# Patient Record
Sex: Male | Born: 1965 | Race: White | Hispanic: No | Marital: Married | Smoking: Never smoker
Health system: Southern US, Community
[De-identification: ages and names within clinical notes are randomized; demographics above are authoritative.]

## PROBLEM LIST (undated history)

## (undated) DIAGNOSIS — E538 Deficiency of other specified B group vitamins: Secondary | ICD-10-CM

## (undated) DIAGNOSIS — J942 Hemothorax: Secondary | ICD-10-CM

## (undated) DIAGNOSIS — S02609A Fracture of mandible, unspecified, initial encounter for closed fracture: Secondary | ICD-10-CM

## (undated) DIAGNOSIS — E215 Disorder of parathyroid gland, unspecified: Secondary | ICD-10-CM

## (undated) DIAGNOSIS — S0291XA Unspecified fracture of skull, initial encounter for closed fracture: Secondary | ICD-10-CM

## (undated) DIAGNOSIS — Z91018 Allergy to other foods: Secondary | ICD-10-CM

## (undated) DIAGNOSIS — E785 Hyperlipidemia, unspecified: Secondary | ICD-10-CM

## (undated) HISTORY — PX: OTHER SURGICAL HISTORY: SHX169

## (undated) HISTORY — PX: CHEST TUBE INSERTION: SHX231

## (undated) HISTORY — PX: BIOPSY THYROID: PRO38

---

## 2003-10-17 ENCOUNTER — Inpatient Hospital Stay (HOSPITAL_COMMUNITY): Admission: EM | Admit: 2003-10-17 | Discharge: 2003-10-18 | Payer: Self-pay | Admitting: Emergency Medicine

## 2009-10-05 ENCOUNTER — Emergency Department (HOSPITAL_COMMUNITY): Admission: EM | Admit: 2009-10-05 | Discharge: 2009-10-05 | Payer: Self-pay | Admitting: Emergency Medicine

## 2009-10-06 ENCOUNTER — Emergency Department (HOSPITAL_COMMUNITY): Admission: EM | Admit: 2009-10-06 | Discharge: 2009-10-06 | Payer: Self-pay | Admitting: Emergency Medicine

## 2010-04-08 ENCOUNTER — Emergency Department (HOSPITAL_COMMUNITY): Admission: EM | Admit: 2010-04-08 | Discharge: 2010-04-08 | Payer: Self-pay | Admitting: Emergency Medicine

## 2010-09-10 LAB — COMPREHENSIVE METABOLIC PANEL
ALT: 25 U/L (ref 0–53)
AST: 19 U/L (ref 0–37)
Alkaline Phosphatase: 110 U/L (ref 39–117)
CO2: 30 mEq/L (ref 19–32)
Calcium: 9.7 mg/dL (ref 8.4–10.5)
Chloride: 105 mEq/L (ref 96–112)
GFR calc Af Amer: >60 mL/min (ref >60)
GFR calc non Af Amer: >60 mL/min (ref >60)
Glucose, Bld: 83 mg/dL (ref 70–99)
Potassium: 3.6 mEq/L (ref 3.5–5.1)
Sodium: 140 mEq/L (ref 135–145)

## 2010-09-10 LAB — DIFFERENTIAL
Basophils Relative: 0 % (ref 0–1)
Eosinophils Absolute: 0.6 10*3/uL (ref 0.0–0.7)
Eosinophils Relative: 6 % — ABNORMAL HIGH (ref 0–5)
Lymphs Abs: 1.8 10*3/uL (ref 0.7–4.0)
Neutrophils Relative %: 69 % (ref 43–77)

## 2010-09-10 LAB — POCT CARDIAC MARKERS
CKMB, poc: 1.1 ng/mL (ref 1.0–8.0)
Troponin i, poc: <0.05 ng/mL (ref 0.00–0.09)

## 2010-09-10 LAB — CBC
Hemoglobin: 15.4 g/dL (ref 13.0–17.0)
MCHC: 34.3 g/dL (ref 30.0–36.0)
RBC: 5.08 MIL/uL (ref 4.22–5.81)
WBC: 9.6 10*3/uL (ref 4.0–10.5)

## 2010-09-10 LAB — D-DIMER, QUANTITATIVE: D-Dimer, Quant: 0.26 ug/mL-FEU (ref 0.00–0.48)
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
## 2010-11-08 NOTE — Discharge Summary (Signed)
NAME:  Jeff Schultz, Jeff Schultz NO.:  [id number]   MEDICAL RECORD NO.:  [id number]                   PATIENT TYPE:  INP   LOCATION:  4711                                 FACILITY:  [facility]   PHYSICIAN:  Rollene Rotunda, M.D.                DATE OF BIRTH:  Mar 31, 1966   DATE OF ADMISSION:  10/17/2003  DATE OF DISCHARGE:  10/18/2003                                 DISCHARGE SUMMARY   PROCEDURES:  1. Cardiac catheterization.  2. Coronary arteriogram.  3. Left ventriculogram.   HOSPITAL COURSE:  Jeff Schultz is a 45 year old male with no known history  of coronary artery disease.  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  He had an echocardiogram and blood work by his description and was  discharged on October 03, 2003.  He was supposed to follow up with a Dr. Ladona Ridgel  in [place] and have a chest CT as well.  He had, by description he had  a Dobutamine Cardiolite that was negative on October 09, 2003.  He also had an  abdominal ultrasound.  He went back to work on the day before admission  having been treated with Protonix.  On the day of admission, he developed  chest pain at work and it was felt that he had some slurred speech and some  confusion.  His pain started on his left side of the chest at about 10:30  a.m.  He was admitted to rule out MI and for further evaluation.   His cardiac risk factors include a strong family history of premature  coronary artery disease.  Because of that and because of recurrent pain with  a work up that was otherwise negative, it was felt that a cardiac  catheterization was indicated and this was performed on October 18, 2003.   A cardiac catheterization showed no significant coronary artery disease.  He  had an osteal 25% stenosis in the ramus intermedius.  No other significant  disease except for some luminal irregularities in the RCA which was  the  dominant vessel.  His EF was 65% with normal wall motion.  Dr. Antoine Poche  evaluated the films and felt that he had minimal coronary plaquing and a  normal LV.  No further cardiac work up was indicated.   A smoking cessation consult was called and he was seen for this while he was  in the hospital.  At 1745, his groin was without ecchymosis or hematomas.  He was given instructions fluid restrictions and care for his wound and was  deemed stable for discharge on October 18, 2003.   DISCHARGE CONDITION:  Stable.   DISCHARGE DIAGNOSES:  1. Chest pain, no significant coronary artery disease by catheter this     admission with a normal ejection fraction.  2. History of allergies to penicillin, antihistamines and  aspirin.  3. Family history of premature coronary artery disease.  4. Hyperlipidemia.  5. History of tobacco use.  6. History of recent negative chest CT scan and negative Dobutamine     Cardiolite by report.   DISCHARGE INSTRUCTIONS:  His activity level is to include no strenuous  activity for the next few days.  He is to call the office for any problems  with the catheter site.  He is to stick to a low fat diet.  He is to follow  up with Dr. Larena Sox in [place], [place], see him within two weeks.   DISCHARGE MEDICATIONS:  1. Protonix 40 mg daily.  2. Aspirin 81 mg daily.      Theodore Demark, P.A. [facility]                  Rollene Rotunda, M.D.    RB/MEDQ  D:  11/08/2003  T:  11/09/2003  Job:  [id number]   cc:   Larena Sox, M.D.  Liam Graham, M.D.   Rollene Rotunda, M.D.

## 2010-11-08 NOTE — Cardiovascular Report (Signed)
NAME:  Jeff Schultz, WEDIN NO.:  [id number]   MEDICAL RECORD NO.:  [id number]                   PATIENT TYPE:  INP   LOCATION:  4711                                 FACILITY:  [facility]   PHYSICIAN:  Rollene Rotunda, M.D.                DATE OF BIRTH:  1966/01/08   DATE OF PROCEDURE:  10/18/2003  DATE OF DISCHARGE:  10/18/2003                              CARDIAC CATHETERIZATION   The primary is Dr. Benito Mccreedy, [address] [facility] [address], [place]. [address],  [place], [place] [address].   PROCEDURE:  Left heart catheterization/coronary arteriography.   INDICATION:  Evaluate patient with chest pain suggestive of unstable angina.   PROCEDURAL NOTE:  Left heart catheterization is performed in the right  femoral artery.  The artery was cannulated using an anterior wall puncture.  A #6 French arterial sheath was inserted via the modified Seldinger  technique.  Preformed Judkins and a pigtail catheter were utilized.  The  patient tolerated the procedure well and left the lab in stable condition.   RESULTS:   HEMODYNAMICS:  LV 112/18, AO 118/89.   CORONARIES:  1. Left main was normal.  2. The LAD was normal.  3. The circumflex in the AV groove was normal.  4. The ramus intermedius was very large and had ostial 25% stenosis.  5. The first obtuse marginal was large and normal.  6. Right coronary artery was dominant.  There was a small PDA and     posterolateral.  There were proximal and mid luminal irregularities.   LEFT VENTRICULOGRAM:  The left ventriculogram was obtained in the RAO  projection.  The EF was 65% with normal wall motion.   CONCLUSION:  1. Minimal coronary plaque.  2. Normal left ventricular function.   PLAN:  No further cardiac workup is suggested.  The patient's pain appears  to be nonanginal.                                               Rollene Rotunda, M.D.    JH/MEDQ  D:  10/18/2003  T:  10/18/2003  Job:  [id number]   cc:   Benito Mccreedy, Dr.  [address]  [address]  [place], [place] [address]

## 2011-06-25 ENCOUNTER — Emergency Department (HOSPITAL_COMMUNITY)
Admission: EM | Admit: 2011-06-25 | Discharge: 2011-06-25 | Disposition: A | Discharge status: Home / Self Care | Payer: Self-pay | Attending: Emergency Medicine | Admitting: Emergency Medicine

## 2011-06-25 ENCOUNTER — Emergency Department (HOSPITAL_COMMUNITY): Payer: Self-pay

## 2011-06-25 ENCOUNTER — Encounter: Payer: Self-pay | Admitting: *Deleted

## 2011-06-25 DIAGNOSIS — R10813 Right lower quadrant abdominal tenderness: Secondary | ICD-10-CM | POA: Insufficient documentation

## 2011-06-25 DIAGNOSIS — R112 Nausea with vomiting, unspecified: Secondary | ICD-10-CM | POA: Insufficient documentation

## 2011-06-25 DIAGNOSIS — R109 Unspecified abdominal pain: Secondary | ICD-10-CM | POA: Insufficient documentation

## 2011-06-25 HISTORY — DX: Hemothorax: J94.2

## 2011-06-25 LAB — CBC
MCH: 29.2 pg (ref 26.0–34.0)
MCHC: 34.2 g/dL (ref 30.0–36.0)
Platelets: 266 10*3/uL (ref 150–400)

## 2011-06-25 LAB — COMPREHENSIVE METABOLIC PANEL
ALT: 26 U/L (ref 0–53)
AST: 18 U/L (ref 0–37)
Albumin: 4.9 g/dL (ref 3.5–5.2)
Alkaline Phosphatase: 133 U/L — ABNORMAL HIGH (ref 39–117)
Potassium: 4 mEq/L (ref 3.5–5.1)
Sodium: 137 mEq/L (ref 135–145)
Total Protein: 8.9 g/dL — ABNORMAL HIGH (ref 6.0–8.3)

## 2011-06-25 LAB — URINALYSIS, ROUTINE W REFLEX MICROSCOPIC
Bilirubin Urine: NEGATIVE
Ketones, ur: NEGATIVE mg/dL
Leukocytes, UA: NEGATIVE
Nitrite: NEGATIVE
Specific Gravity, Urine: 1.013 (ref 1.005–1.030)
Urobilinogen, UA: 0.2 mg/dL (ref 0.0–1.0)

## 2011-06-25 LAB — DIFFERENTIAL
Basophils Absolute: 0 10*3/uL (ref 0.0–0.1)
Basophils Relative: 0 % (ref 0–1)
Eosinophils Absolute: 0.8 10*3/uL — ABNORMAL HIGH (ref 0.0–0.7)
Neutro Abs: 6.2 10*3/uL (ref 1.7–7.7)
Neutrophils Relative %: 59 % (ref 43–77)

## 2011-06-25 LAB — URINE MICROSCOPIC-ADD ON

## 2011-06-25 MED ORDER — MORPHINE SULFATE 4 MG/ML IJ SOLN
4.0000 mg | Freq: Once | INTRAMUSCULAR | Status: AC
  Administered 2011-06-25: 4 mg via INTRAVENOUS
  Filled 2011-06-25: qty 1

## 2011-06-25 MED ORDER — ONDANSETRON HCL 4 MG/2ML IJ SOLN
4.0000 mg | Freq: Once | INTRAMUSCULAR | Status: AC
  Administered 2011-06-25: 4 mg via INTRAVENOUS
  Filled 2011-06-25: qty 2

## 2011-06-25 MED ORDER — HYDROCODONE-ACETAMINOPHEN 5-500 MG PO TABS: 1.0000 | ORAL_TABLET | Freq: Four times a day (QID) | ORAL | Status: AC | PRN

## 2011-06-25 MED ORDER — IOHEXOL 300 MG/ML  SOLN
100.0000 mL | Freq: Once | INTRAMUSCULAR | Status: AC | PRN
  Administered 2011-06-25: 100 mL via INTRAVENOUS

## 2011-06-25 MED ORDER — SODIUM CHLORIDE 0.9 % IV BOLUS (SEPSIS)
1000.0000 mL | Freq: Once | INTRAVENOUS | Status: AC
  Administered 2011-06-25: 1000 mL via INTRAVENOUS

## 2011-06-25 NOTE — ED Provider Notes (Signed)
Pt screened in triage. Sharp stabbing abd pain since 10am this morning, hasn't changed since onset. Located in RLQ. Poor appetite today, +vomiting. Going over bumps on car ride to ED was painful. No PMH abd surg. Pt doubled over in pain in triage, has vomited since arrival. Afebrile.  Abd exam: Exquisitely tender in RLQ, +rebound, guarding.   Prelim labs, CT ordered. Will move to back when bed available.  Grant Fontana, [place] 06/25/11 1625

## 2011-06-25 NOTE — ED Notes (Signed)
Pt c/o RLQ pain, worse with palpation, has vomited x 1

## 2011-06-25 NOTE — ED Notes (Signed)
Pt back from ct.  Pt ambulatory to restroom.

## 2011-06-25 NOTE — ED Notes (Signed)
ZOX:[id number]<AV> Expected date:06/25/11<BR> Expected time: 5:07 PM<BR> Means of arrival:Ambulance<BR> Comments:<BR> EMS 40 GC - OD sleeping pills/suicidal

## 2011-06-25 NOTE — ED Provider Notes (Signed)
Medical screening examination/treatment/procedure(s) were performed by non-physician practitioner and as supervising physician I was immediately available for consultation/collaboration.  Juliet Rude. Rubin Payor, MD 06/25/11 [phone]

## 2011-06-25 NOTE — ED Provider Notes (Signed)
History     CSN: [id number]  Arrival date & time 06/25/11  1404   First MD Initiated Contact with Patient 06/25/11 1712      Chief Complaint  Patient presents with  . Abdominal Pain    (Consider location/radiation/quality/duration/timing/severity/associated sxs/prior treatment) Patient is a 46 y.o. male presenting with abdominal pain. The history is provided by the patient.  Abdominal Pain The primary symptoms of the illness include abdominal pain, nausea and vomiting. The primary symptoms of the illness do not include fever, shortness of breath, diarrhea, hematemesis or dysuria. The current episode started 6 to 12 hours ago. The onset of the illness was sudden. The problem has not changed since onset. Additional symptoms associated with the illness include anorexia. Symptoms associated with the illness do not include chills, constipation, frequency or back pain.  Pt states he woke up with pain, gradually worsening. States pain worsened by movement, and pressing on his abdomen. Vomited x1. Denies fever, chills, diarrhea, urinary symptoms. No hx of the same.  Past Medical History  Diagnosis Date  . Hemopneumothorax     History reviewed. No pertinent past surgical history.  No family history on file.  History  Substance Use Topics  . Smoking status: Never Smoker   . Smokeless tobacco: Current User  . Alcohol Use: No      Review of Systems  Constitutional: Negative for fever and chills.  HENT: Negative.   Eyes: Negative.   Respiratory: Negative for chest tightness and shortness of breath.   Cardiovascular: Negative.   Gastrointestinal: Positive for nausea, vomiting, abdominal pain and anorexia. Negative for diarrhea, constipation and hematemesis.  Genitourinary: Negative.  Negative for dysuria and frequency.  Musculoskeletal: Negative.  Negative for back pain.  Skin: Negative.   Neurological: Negative.   Psychiatric/Behavioral: Negative.     Allergies   Penicillins  Home Medications   Current Outpatient Rx  Name Route Sig Dispense Refill  . NAPROXEN SODIUM 220 MG PO TABS Oral Take 440 mg by mouth 2 (two) times daily as needed. For pain.       BP 118/91  Pulse 59  Temp(Src) 97.9 F (36.6 C) (Oral)  Resp 22  Wt 211 lb (95.709 kg)  SpO2 99%  Physical Exam  Nursing note and vitals reviewed. Constitutional: He is oriented to person, place, and time. He appears well-developed and well-nourished.  HENT:  Head: Normocephalic.  Eyes: Conjunctivae are normal.  Neck: Neck supple.  Cardiovascular: Normal rate, regular rhythm and normal heart sounds.   Pulmonary/Chest: Effort normal and breath sounds normal. He has no wheezes.  Abdominal: Soft. Bowel sounds are normal. There is tenderness in the right lower quadrant. There is guarding.  Genitourinary: Rectum normal and prostate normal.  Musculoskeletal: Normal range of motion.  Neurological: He is alert and oriented to person, place, and time.  Skin: Skin is warm and dry. No erythema.  Psychiatric: He has a normal mood and affect.    ED Course  Procedures (including critical care time)  5:28 PM Exam concerning for acute appendicitis. Labs and CT abd/pelvis ordered  Results for orders placed during the hospital encounter of 06/25/11  URINALYSIS, ROUTINE W REFLEX MICROSCOPIC      Component Value Range   Color, Urine YELLOW  YELLOW    APPearance CLEAR  CLEAR    Specific Gravity, Urine 1.013  1.005 - 1.030    pH 6.0  5.0 - 8.0    Glucose, UA NEGATIVE  NEGATIVE (mg/dL)   Hgb urine dipstick SMALL (*)  NEGATIVE    Bilirubin Urine NEGATIVE  NEGATIVE    Ketones, ur NEGATIVE  NEGATIVE (mg/dL)   Protein, ur NEGATIVE  NEGATIVE (mg/dL)   Urobilinogen, UA 0.2  0.0 - 1.0 (mg/dL)   Nitrite NEGATIVE  NEGATIVE    Leukocytes, UA NEGATIVE  NEGATIVE   CBC      Component Value Range   WBC 10.5  4.0 - 10.5 (K/uL)   RBC 5.76  4.22 - 5.81 (MIL/uL)   Hemoglobin 16.8  13.0 - 17.0 (g/dL)   HCT  16.1  09.6 - 04.5 (%)   MCV 85.2  78.0 - 100.0 (fL)   MCH 29.2  26.0 - 34.0 (pg)   MCHC 34.2  30.0 - 36.0 (g/dL)   RDW 40.9  81.1 - 91.4 (%)   Platelets 266  150 - 400 (K/uL)  DIFFERENTIAL      Component Value Range   Neutrophils Relative 59  43 - 77 (%)   Neutro Abs 6.2  1.7 - 7.7 (K/uL)   Lymphocytes Relative 27  12 - 46 (%)   Lymphs Abs 2.8  0.7 - 4.0 (K/uL)   Monocytes Relative 6  3 - 12 (%)   Monocytes Absolute 0.7  0.1 - 1.0 (K/uL)   Eosinophils Relative 8 (*) 0 - 5 (%)   Eosinophils Absolute 0.8 (*) 0.0 - 0.7 (K/uL)   Basophils Relative 0  0 - 1 (%)   Basophils Absolute 0.0  0.0 - 0.1 (K/uL)  COMPREHENSIVE METABOLIC PANEL      Component Value Range   Sodium 137  135 - 145 (mEq/L)   Potassium 4.0  3.5 - 5.1 (mEq/L)   Chloride 101  96 - 112 (mEq/L)   CO2 26  19 - 32 (mEq/L)   Glucose, Bld 79  70 - 99 (mg/dL)   BUN 21  6 - 23 (mg/dL)   Creatinine, Ser 7.82  0.50 - 1.35 (mg/dL)   Calcium 95.6  8.4 - 10.5 (mg/dL)   Total Protein 8.9 (*) 6.0 - 8.3 (g/dL)   Albumin 4.9  3.5 - 5.2 (g/dL)   AST 18  0 - 37 (U/L)   ALT 26  0 - 53 (U/L)   Alkaline Phosphatase 133 (*) 39 - 117 (U/L)   Total Bilirubin 0.3  0.3 - 1.2 (mg/dL)   GFR calc non Af Amer >90  >90 (mL/min)   GFR calc Af Amer >90  >90 (mL/min)  URINE MICROSCOPIC-ADD ON      Component Value Range   Squamous Epithelial / LPF RARE  RARE    RBC / HPF 3-6  <3 (RBC/hpf)   Bacteria, UA FEW (*) RARE    Ct Abdomen Pelvis W Contrast  06/25/2011  *RADIOLOGY REPORT*  Clinical Data: Abdominal and right lower quadrant pain and tenderness.  CT ABDOMEN AND PELVIS WITH CONTRAST  Technique:  Multidetector CT imaging of the abdomen and pelvis was performed following the standard protocol during bolus administration of intravenous contrast.  Contrast: OMNIPAQUE IOHEXOL 300 MG/ML IV SOLN  Comparison: None.  Findings: Lung bases are clear.  No pleural or pericardial fluid. The liver has a normal appearance except for a 4 mm cyst in the  central portion.  No calcified gallstones.  The spleen is normal. The pancreas is normal.  The adrenal glands are normal.  The kidneys are normal except for a 1 cm cyst in the midportion of the left kidney.  No stone or hydronephrosis.  The aorta shows atherosclerotic change but no  aneurysm.  The IVC is normal.  The appendix is normal.  No other bowel pathology is suspected. Bladder, prostate gland and seminal vesicles appear normal.  IMPRESSION: No cause of the patient's described symptoms is identified. Specifically, the appendix appears normal.  Original Report Authenticated By: Thomasenia Sales, M.D.   7:02 PM CT negative for appendicitis or any other abnormalities. VS normal. Labs unremarkable. Prostate non tender. Suspect possible muscular cause of the pain. Will d/c home with pain medications and follow up. MDM         Lottie Mussel, PA 06/25/11 [phone]

## 2011-06-26 LAB — URINE CULTURE
Culture  Setup Time: 201301030157
Culture: NO GROWTH

## 2011-06-26 NOTE — ED Provider Notes (Signed)
Medical screening examination/treatment/procedure(s) were performed by non-physician practitioner and as supervising physician I was immediately available for consultation/collaboration.  Flint Melter, MD 06/26/11 1258

## 2011-09-25 ENCOUNTER — Encounter: Payer: Self-pay | Admitting: *Deleted

## 2012-11-04 ENCOUNTER — Encounter (HOSPITAL_COMMUNITY): Payer: Self-pay | Admitting: *Deleted

## 2012-11-04 ENCOUNTER — Emergency Department (HOSPITAL_COMMUNITY)
Admission: EM | Admit: 2012-11-04 | Discharge: 2012-11-04 | Disposition: A | Discharge status: Home / Self Care | Payer: 59 | Attending: Emergency Medicine | Admitting: Emergency Medicine

## 2012-11-04 ENCOUNTER — Emergency Department (HOSPITAL_COMMUNITY): Payer: 59

## 2012-11-04 DIAGNOSIS — Y9289 Other specified places as the place of occurrence of the external cause: Secondary | ICD-10-CM | POA: Insufficient documentation

## 2012-11-04 DIAGNOSIS — S8990XA Unspecified injury of unspecified lower leg, initial encounter: Secondary | ICD-10-CM | POA: Insufficient documentation

## 2012-11-04 DIAGNOSIS — Z880 Allergy status to penicillin: Secondary | ICD-10-CM | POA: Insufficient documentation

## 2012-11-04 DIAGNOSIS — Y9389 Activity, other specified: Secondary | ICD-10-CM | POA: Insufficient documentation

## 2012-11-04 DIAGNOSIS — M25562 Pain in left knee: Secondary | ICD-10-CM

## 2012-11-04 DIAGNOSIS — Z8709 Personal history of other diseases of the respiratory system: Secondary | ICD-10-CM | POA: Insufficient documentation

## 2012-11-04 MED ORDER — TRAMADOL HCL 50 MG PO TABS: 50.0000 mg | ORAL_TABLET | Freq: Four times a day (QID) | ORAL | Status: DC | PRN

## 2012-11-04 MED ORDER — TRAMADOL HCL 50 MG PO TABS
50.0000 mg | ORAL_TABLET | Freq: Once | ORAL | Status: AC
  Administered 2012-11-04: 50 mg via ORAL
  Filled 2012-11-04: qty 1

## 2012-11-04 NOTE — ED Provider Notes (Signed)
History     CSN: [id number]  Arrival date & time 11/04/12  0216   None     Chief Complaint  Patient presents with  . Knee Pain    (Consider location/radiation/quality/duration/timing/severity/associated sxs/prior treatment) HPI Comments: Patient is a 47 year old male who presents for left knee pain with onset one week ago. 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 Patient admits to a constant sharp pain in his left knee that is waxing and waning in severity and occasionally radiates down his left lower extremity to his left foot. Patient has tried ibuprofen and ice without relief of symptoms. Patient has been ambulatory but states that this makes the pain worse. Patient denies fevers, pallor, numbness, and weakness. He further denies leg swelling and calf tenderness.  Patient is a 47 y.o. male presenting with knee pain. The history is provided by the patient. No language interpreter was used.  Knee Pain Associated symptoms: no fever     Past Medical History  Diagnosis Date  . Hemopneumothorax     Past Surgical History  Procedure Laterality Date  . Collapsed lung    . Chest tube insertion      Family History  Problem Relation Age of Onset  . Cancer    . Diabetes    . Heart attack    . Stroke      History  Substance Use Topics  . Smoking status: Never Smoker   . Smokeless tobacco: Current User    Types: Snuff  . Alcohol Use: Yes     Comment: occasional     Review of Systems  Constitutional: Negative for fever.  Respiratory: Negative for shortness of breath.   Cardiovascular: Negative for leg swelling.  Musculoskeletal: Positive for arthralgias. Negative for joint swelling.  Skin: Negative for pallor.  Neurological: Negative for syncope, weakness and numbness.  All other systems reviewed and are negative.    Allergies  Hydrocodone and Penicillins  Home Medications   Current Outpatient Rx  Name   Route  Sig  Dispense  Refill  . acetaminophen (TYLENOL) 500 MG tablet   Oral   Take 500 mg by mouth every 6 (six) hours as needed for pain.         . naproxen sodium (ANAPROX) 220 MG tablet   Oral   Take 440 mg by mouth 2 (two) times daily as needed. For pain.          . traMADol (ULTRAM) 50 MG tablet   Oral   Take 1 tablet (50 mg total) by mouth every 6 (six) hours as needed for pain.   20 tablet   0     BP 118/78  Pulse 66  Temp(Src) 97.5 F (36.4 C) (Oral)  Resp 18  SpO2 97%  Physical Exam  Nursing note and vitals reviewed. Constitutional: He is oriented to person, place, and time. He appears well-developed and well-nourished. No distress.  HENT:  Head: Normocephalic and atraumatic.  Eyes: Conjunctivae and EOM are normal. No scleral icterus.  Neck: Normal range of motion.  Cardiovascular: Normal rate, regular rhythm and intact distal pulses.   DP and PT pulses 2+ b/l  Pulmonary/Chest: Effort normal. No respiratory distress.  Musculoskeletal: He exhibits no edema.       Left knee: He exhibits decreased range of motion, swelling (minimal) and bony tenderness. He exhibits no effusion, no ecchymosis, no deformity, no erythema, normal alignment, no LCL laxity and no MCL  laxity. Tenderness found. Medial joint line and lateral joint line tenderness noted.       Left upper leg: Normal.       Left lower leg: Normal.  Diffuse TTP of L knee; no laxity or deformity appreciated. Patient neurovascularly intact without sensory or motor deficits.  Neurological: He is alert and oriented to person, place, and time.  No sensory or motor deficits appreciated.  Skin: Skin is warm and dry. No rash noted. He is not diaphoretic. No erythema. No pallor.  Psychiatric: He has a normal mood and affect. His behavior is normal.    ED Course  Procedures (including critical care time)  Labs Reviewed - No data to display Dg Knee Complete 4 Views Left  11/04/2012   *RADIOLOGY REPORT*  Clinical  Data: Lateral knee pain following twisting injury.  LEFT KNEE - COMPLETE 4+ VIEW  Comparison: None.  Findings: Small bony exostosis along the posteromedial metadiaphyseal femur. No displaced fracture or dislocation.  No definite joint effusion.  IMPRESSION: No acute osseous finding of the left knee.  Small bony exostosis along the posteromedial distal femur may reflect an osteochondroma. If stable in size and asymptomatic, of doubtful clinical significance.  If enlarging or painful, consider further evaluating with MRI.   Original Report Authenticated By: Jearld Lesch, M.D.    1. Knee pain, acute, left     MDM  Uncomplicated left knee pain x 1 week. On physical exam patient is neurovascularly intact without sensory or motor deficits. There is diffuse tenderness to palpation of the left knee; no laxity or deformities appreciated. X-ray without evidence of acute bony abnormalities such as fracture or dislocation. Small exostosis appreciated along the posteromedial distal femur.   Knee immobilizer applied and crutches provided in ED. Patient appropriate for discharge with orthopedic followup for further evaluation of symptoms. Tramadol provided for pain management. Indications for ED return discussed. Patient states comfort and understanding with this discharge plan with no unaddressed concerns. Patient work up and management discussed with Dr. Norlene Campbell who is in agreement.        Antony Madura, [name] 11/08/12 1941

## 2012-11-04 NOTE — ED Notes (Signed)
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

## 2012-11-09 NOTE — ED Provider Notes (Signed)
Medical screening examination/treatment/procedure(s) were performed by non-physician practitioner and as supervising physician I was immediately available for consultation/collaboration.  Evony Rezek M Anish Vana, MD 11/09/12 0718 

## 2012-11-15 ENCOUNTER — Emergency Department (HOSPITAL_COMMUNITY)
Admission: EM | Admit: 2012-11-15 | Discharge: 2012-11-15 | Disposition: A | Discharge status: Home / Self Care | Payer: 59 | Attending: Emergency Medicine | Admitting: Emergency Medicine

## 2012-11-15 ENCOUNTER — Encounter (HOSPITAL_COMMUNITY): Payer: Self-pay | Admitting: Emergency Medicine

## 2012-11-15 DIAGNOSIS — Y92009 Unspecified place in unspecified non-institutional (private) residence as the place of occurrence of the external cause: Secondary | ICD-10-CM | POA: Insufficient documentation

## 2012-11-15 DIAGNOSIS — R209 Unspecified disturbances of skin sensation: Secondary | ICD-10-CM | POA: Insufficient documentation

## 2012-11-15 DIAGNOSIS — M545 Low back pain, unspecified: Secondary | ICD-10-CM | POA: Insufficient documentation

## 2012-11-15 DIAGNOSIS — Z8709 Personal history of other diseases of the respiratory system: Secondary | ICD-10-CM | POA: Insufficient documentation

## 2012-11-15 DIAGNOSIS — M549 Dorsalgia, unspecified: Secondary | ICD-10-CM

## 2012-11-15 DIAGNOSIS — IMO0002 Reserved for concepts with insufficient information to code with codable children: Secondary | ICD-10-CM | POA: Insufficient documentation

## 2012-11-15 DIAGNOSIS — Y93E6 Activity, residential relocation: Secondary | ICD-10-CM | POA: Insufficient documentation

## 2012-11-15 DIAGNOSIS — G8929 Other chronic pain: Secondary | ICD-10-CM | POA: Insufficient documentation

## 2012-11-15 DIAGNOSIS — X503XXA Overexertion from repetitive movements, initial encounter: Secondary | ICD-10-CM | POA: Insufficient documentation

## 2012-11-15 DIAGNOSIS — Z8781 Personal history of (healed) traumatic fracture: Secondary | ICD-10-CM | POA: Insufficient documentation

## 2012-11-15 HISTORY — DX: Fracture of mandible, unspecified, initial encounter for closed fracture: S02.609A

## 2012-11-15 HISTORY — DX: Unspecified fracture of skull, initial encounter for closed fracture: S02.91XA

## 2012-11-15 MED ORDER — NAPROXEN 375 MG PO TABS
375.0000 mg | ORAL_TABLET | Freq: Once | ORAL | Status: AC
  Administered 2012-11-15: 375 mg via ORAL
  Filled 2012-11-15: qty 1

## 2012-11-15 MED ORDER — TRAMADOL HCL 50 MG PO TABS
50.0000 mg | ORAL_TABLET | Freq: Once | ORAL | Status: AC
  Administered 2012-11-15: 50 mg via ORAL
  Filled 2012-11-15: qty 1

## 2012-11-15 MED ORDER — PREDNISONE 20 MG PO TABS: 20.0000 mg | ORAL_TABLET | Freq: Three times a day (TID) | ORAL | Status: DC

## 2012-11-15 MED ORDER — TRAMADOL HCL 50 MG PO TABS: 50.0000 mg | ORAL_TABLET | Freq: Four times a day (QID) | ORAL | Status: DC | PRN

## 2012-11-15 NOTE — ED Provider Notes (Signed)
History     CSN: [id number]  Arrival date & time 11/15/12  1514   First MD Initiated Contact with Patient 11/15/12 1613      Chief Complaint  Patient presents with  . Back Pain    (Consider location/radiation/quality/duration/timing/severity/associated sxs/prior treatment) HPI Comments: Jeff Schultz is a 47 y/o M with PMHx of chronic back pain presenting to the ED with worsening back pain since Friday. Patient reported that he is in the process of moving and has been lifting a lot of boxes, stating that he has had increase in back pain since lifting and moving boxes - stated that he has been lifting heavy boxed. Stated that the pain is localized to the lower back - described as a "knife in my lower back trying to separate my spinal cord." Patient reported that the pain radiates to the left hip and left leg - stated that there is mild numbness and tingling to the leg. Wife and patient reported that these symptoms are very similar to whenever he re-injures his back. Stated that these symptoms occur at least a couple of times a month. Denied chest pain, shortness of breathe, difficulty breathing, gi symptoms, urinary symptoms, urinary incontinence, bowel incontinence, weakness.  The history is provided by the patient. No language interpreter was used.    Past Medical History  Diagnosis Date  . Hemopneumothorax   . Jaw fracture   . Skull fracture     Past Surgical History  Procedure Laterality Date  . Collapsed lung    . Chest tube insertion      Family History  Problem Relation Age of Onset  . Cancer    . Diabetes    . Heart attack    . Stroke      History  Substance Use Topics  . Smoking status: Never Smoker   . Smokeless tobacco: Current User    Types: Snuff  . Alcohol Use: Yes     Comment: occasional      Review of Systems  Constitutional: Negative for fever, chills and fatigue.  HENT: Negative for ear pain, sore throat, trouble swallowing, neck pain and  neck stiffness.   Eyes: Negative for photophobia, pain and visual disturbance.  Respiratory: Negative for cough, chest tightness and shortness of breath.   Cardiovascular: Negative for chest pain.  Gastrointestinal: Negative for nausea, vomiting, abdominal pain, diarrhea, constipation and blood in stool.  Genitourinary: Negative for dysuria, decreased urine volume and difficulty urinating.  Musculoskeletal: Positive for back pain.  Neurological: Positive for numbness. Negative for dizziness, weakness, light-headedness and headaches.  All other systems reviewed and are negative.    Allergies  Penicillins and Hydrocodone  Home Medications   Current Outpatient Rx  Name  Route  Sig  Dispense  Refill  . traMADol (ULTRAM) 50 MG tablet   Oral   Take 1 tablet (50 mg total) by mouth every 6 (six) hours as needed for pain.   20 tablet   0   . predniSONE (DELTASONE) 20 MG tablet   Oral   Take 1 tablet (20 mg total) by mouth 3 (three) times daily.   15 tablet   0     BP 118/73  Pulse 66  Temp(Src) 97.7 F (36.5 C) (Oral)  Resp 20  Wt 226 lb (102.513 kg)  SpO2 99%  Physical Exam  Nursing note and vitals reviewed. Constitutional: He is oriented to person, place, and time. He appears well-developed and well-nourished. No distress.  HENT:  Head: Normocephalic  and atraumatic.  Mouth/Throat: Oropharynx is clear and moist. No oropharyngeal exudate.  Uvula midline, symmetrical  Eyes: Conjunctivae and EOM are normal. Pupils are equal, round, and reactive to light. Right eye exhibits no discharge. Left eye exhibits no discharge.  Neck: Normal range of motion. Neck supple. No thyromegaly present.  Negative neck stiffness Negative nuchal rigidity Negative lymphadenopathy  Cardiovascular: Normal rate, regular rhythm and normal heart sounds.  Exam reveals no friction rub.   No murmur heard. Pulses:      Radial pulses are 2+ on the right side, and 2+ on the left side.       Dorsalis  pedis pulses are 2+ on the right side, and 2+ on the left side.  Pulmonary/Chest: Effort normal and breath sounds normal. No respiratory distress. He has no wheezes. He has no rales.  Musculoskeletal: He exhibits tenderness. He exhibits no edema.  Decreased ROM to the left hip secondary to pain Strength 5+/5+ to lower extremities, bilaterally  Lymphadenopathy:    He has no cervical adenopathy.  Neurological: He is alert and oriented to person, place, and time. No cranial nerve deficit. He exhibits normal muscle tone. Coordination normal.  Cranial nerves III-XII grossly intact Sensation intact to lower extremities, bilaterally, with differentiation to sharp and dull sensation  Skin: Skin is warm and dry. No rash noted. He is not diaphoretic. No erythema.  Psychiatric: He has a normal mood and affect. His behavior is normal. Thought content normal.    ED Course  Procedures (including critical care time)  Labs Reviewed - No data to display No results found.   1. Back pain       MDM  Patient afebrile, normotensive, non-tachycardic, no tanchypnea, adequate saturation on room air, alert and oriented.  Patient presenting with back pain that is chronic - worsening secondary to lifting heavy boxed over the past week - patient and wife reported that he gets these episodes at least a couple of times a month. Offered MRI in ED setting and patient stated that he does not want one at the moment and will get one as an outpatient. No neurological deficits noted. Gave pain medications in ED setting. Patient discharged. Discharged patient with pain medications and anti-inflammatories - discussed precautions with patient. Discussed with patient to rest and stay hydrated. Referred patient to orthopedics and neurologist - recommended patient to get MRI, stressed important. Discussed with patient to monitor symptoms and if symptoms are to worsen or change to report back to the ED. Note for work given.   Patient agreed to plan of care, understood, all questions answered.         [employer], [name] 11/16/12 [phone]

## 2012-11-15 NOTE — ED Notes (Signed)
Patient with history of bulging discs started with increased pain four days ago.  Patient took a Tramadol at 1230 for the pain.  Rates pain as a 10.  Pain is in lower back and left hip and radiates down left leg.

## 2012-11-18 NOTE — ED Provider Notes (Signed)
Medical screening examination/treatment/procedure(s) were performed by non-physician practitioner and as supervising physician I was immediately available for consultation/collaboration.   Jessica Seidman L Marshell Dilauro, MD 11/18/12 1522 

## 2012-11-26 ENCOUNTER — Other Ambulatory Visit: Payer: Self-pay | Admitting: Family Medicine

## 2012-11-26 DIAGNOSIS — M545 Low back pain: Secondary | ICD-10-CM

## 2012-12-10 ENCOUNTER — Ambulatory Visit
Admission: RE | Admit: 2012-12-10 | Discharge: 2012-12-10 | Disposition: A | Discharge status: Home / Self Care | Payer: 59 | Source: Ambulatory Visit | Attending: Family Medicine | Admitting: Family Medicine

## 2012-12-10 DIAGNOSIS — M545 Low back pain: Secondary | ICD-10-CM

## 2013-08-30 ENCOUNTER — Other Ambulatory Visit: Payer: Self-pay | Admitting: Orthopedic Surgery

## 2013-08-30 DIAGNOSIS — M545 Low back pain, unspecified: Secondary | ICD-10-CM

## 2013-08-30 DIAGNOSIS — R531 Weakness: Secondary | ICD-10-CM

## 2013-08-30 DIAGNOSIS — M5410 Radiculopathy, site unspecified: Secondary | ICD-10-CM

## 2013-09-09 ENCOUNTER — Other Ambulatory Visit: Payer: 59

## 2015-06-26 ENCOUNTER — Emergency Department (HOSPITAL_COMMUNITY)
Admission: EM | Admit: 2015-06-26 | Discharge: 2015-06-26 | Disposition: A | Discharge status: Home / Self Care | Payer: 59 | Attending: Emergency Medicine | Admitting: Emergency Medicine

## 2015-06-26 ENCOUNTER — Emergency Department (HOSPITAL_COMMUNITY): Payer: 59

## 2015-06-26 ENCOUNTER — Encounter (HOSPITAL_COMMUNITY): Payer: Self-pay | Admitting: Emergency Medicine

## 2015-06-26 DIAGNOSIS — R51 Headache: Secondary | ICD-10-CM | POA: Insufficient documentation

## 2015-06-26 DIAGNOSIS — R110 Nausea: Secondary | ICD-10-CM | POA: Insufficient documentation

## 2015-06-26 DIAGNOSIS — R079 Chest pain, unspecified: Secondary | ICD-10-CM | POA: Insufficient documentation

## 2015-06-26 DIAGNOSIS — Z8709 Personal history of other diseases of the respiratory system: Secondary | ICD-10-CM | POA: Insufficient documentation

## 2015-06-26 DIAGNOSIS — R519 Headache, unspecified: Secondary | ICD-10-CM

## 2015-06-26 DIAGNOSIS — Z8781 Personal history of (healed) traumatic fracture: Secondary | ICD-10-CM | POA: Insufficient documentation

## 2015-06-26 DIAGNOSIS — Z880 Allergy status to penicillin: Secondary | ICD-10-CM | POA: Insufficient documentation

## 2015-06-26 DIAGNOSIS — R030 Elevated blood-pressure reading, without diagnosis of hypertension: Secondary | ICD-10-CM | POA: Insufficient documentation

## 2015-06-26 LAB — COMPREHENSIVE METABOLIC PANEL
ALBUMIN: 4.3 g/dL (ref 3.5–5.0)
ALT: 24 U/L (ref 17–63)
AST: 18 U/L (ref 15–41)
Alkaline Phosphatase: 100 U/L (ref 38–126)
Anion gap: 7 (ref 5–15)
BILIRUBIN TOTAL: 0.5 mg/dL (ref 0.3–1.2)
BUN: 18 mg/dL (ref 6–20)
CALCIUM: 9.7 mg/dL (ref 8.9–10.3)
CHLORIDE: 104 mmol/L (ref 101–111)
CO2: 31 mmol/L (ref 22–32)
CREATININE: 0.99 mg/dL (ref 0.61–1.24)
GFR calc Af Amer: >60 mL/min (ref >60)
GFR calc non Af Amer: >60 mL/min (ref >60)
GLUCOSE: 82 mg/dL (ref 65–99)
Potassium: 4.1 mmol/L (ref 3.5–5.1)
SODIUM: 142 mmol/L (ref 135–145)
Total Protein: 7.4 g/dL (ref 6.5–8.1)

## 2015-06-26 LAB — CBC
HEMATOCRIT: 44.1 % (ref 39.0–52.0)
Hemoglobin: 14.9 g/dL (ref 13.0–17.0)
MCH: 30 pg (ref 26.0–34.0)
MCHC: 33.8 g/dL (ref 30.0–36.0)
MCV: 88.7 fL (ref 78.0–100.0)
Platelets: 216 10*3/uL (ref 150–400)
RBC: 4.97 MIL/uL (ref 4.22–5.81)
RDW: 12.5 % (ref 11.5–15.5)
WBC: 10.4 10*3/uL (ref 4.0–10.5)

## 2015-06-26 LAB — APTT: APTT: 30 s (ref 24–37)

## 2015-06-26 LAB — I-STAT TROPONIN, ED: Troponin i, poc: 0 ng/mL (ref 0.00–0.08)

## 2015-06-26 LAB — PROTIME-INR
INR: 0.94 (ref 0.00–1.49)
Prothrombin Time: 12.8 seconds (ref 11.6–15.2)

## 2015-06-26 MED ORDER — ASPIRIN 81 MG PO CHEW
324.0000 mg | CHEWABLE_TABLET | Freq: Once | ORAL | Status: AC
  Administered 2015-06-26: 324 mg via ORAL
  Filled 2015-06-26: qty 4

## 2015-06-26 MED ORDER — SODIUM CHLORIDE 0.9 % IV SOLN: 20.0000 mL | INTRAVENOUS | Status: DC

## 2015-06-26 NOTE — Discharge Instructions (Signed)
°Emergency Department Resource Guide °1) Find a Doctor and Pay Out of Pocket °Although you won't have to find out who is covered by your insurance plan, it is a good idea to ask around and get recommendations. You will then need to call the office and see if the doctor you have chosen will accept you as a new patient and what types of options they offer for patients who are self-pay. Some doctors offer discounts or will set up payment plans for their patients who do not have insurance, but you will need to ask so you aren't surprised when you get to your appointment. ° °2) Contact Your Local Health Department °Not all health departments have doctors that can see patients for sick visits, but many do, so it is worth a call to see if yours does. If you don't know where your local health department is, you can check in your phone book. The CDC also has a tool to help you locate your state's health department, and many state websites also have listings of all of their local health departments. ° °3) Find a Walk-in Clinic °If your illness is not likely to be very severe or complicated, you may want to try a walk in clinic. These are popping up all over the country in pharmacies, drugstores, and shopping centers. They're usually staffed by nurse practitioners or physician assistants that have been trained to treat common illnesses and complaints. They're usually fairly quick and inexpensive. However, if you have serious medical issues or chronic medical problems, these are probably not your best option. ° °No Primary Care Doctor: °- Call [facility] at  832-8000 - they can help you locate a primary care doctor that  accepts your insurance, provides certain services, etc. °- Physician Referral Service- 1-800-533-3463 ° °Chronic Pain Problems: °Organization         Address  Phone   Notes  °[facility]  (336) 297-2271 Patients need to be referred by their primary care doctor.  ° °Medication  Assistance: °Organization         Address  Phone   Notes  °[place] [facility] [address] °[place], [place] [address] (336) 641-8030 --Must be a resident of [place] °-- Must have NO insurance coverage whatsoever (no Medicaid/ Medicare, etc.) °-- The pt. MUST have a primary care doctor that directs their care regularly and follows them in the community °  °[facility]  (866) 331-1348   °[employer]  (888) 892-1162   ° °Agencies that provide inexpensive medical care: °Organization         Address  Phone   Notes  °[facility]  (336) 832-8035   °[facility]    (336) 832-7272   °[facility] [address] °[place], [place] [address] (336) 832-4777   °[facility] [address], °[place] (336) 271-4999   °[facility]    (336) 373-0678   °[facility]    (336) 272-1050   °[facility] ° [address], [place] Phone:  (336) 832-4444, Fax:  (336) 832-4440 Hours of Operation:  9 am - 6 pm, M-F.  Also accepts Medicaid/Medicare and self-pay.  °[facility] ° [address], [place] Phone: (336) [phone], Fax: (336) [phone]. Hours of Operation:  8:30 am - 5:30 pm, M-F.  Also accepts Medicaid and self-pay.  °[facility] [place] [address], [place] Phone: (336) 878-6027   °[facility] [address], [place], [place] (336)723-1848, Ext. 123 Mondays & Thursdays: 7-9 AM.  First 15 patients are seen on a first come, first serve basis. °  ° °Medicaid-accepting [place] Providers: ° °Organization         Address  Phone   Notes  °[facility] [address], [place] (336) 641-2100 Also accepts self-pay patients.  °[facility] [address], [place] ° (336) 856-9996   °[facility] [address], [place]  (336) 288-8857   °[facility] [address] [place] [address], [place] (336) 299-7000   °Veita Bland [address], [place]  ° (336) 373-1557 Only accepts [place] Access Medicaid patients after they have their name applied to their card.  ° °Self-Pay (no insurance) in [place]: ° °Organization         Address  Phone   Notes  °Sickle Cell Patients, [facility] [address], [place] (336) 832-1970   °[facility] [address], [place] (336) 832-4400   °[facility] ° [address], [place] (336) 992-4800   °[facility]/Dr. Osei-Bonsu ° [address] [place] [address], [place] or [address], [place] (336) [phone] Phone number for both [place] and [place] locations is the same.  °[facility] [address], [place] (336) 299-0000   °[facility] [address] [place] [address], [place] or [address] (336) 852-7530 °(336) 878-2260   °[facility] [address], [place] (336) 350-1642, phone; (336) 294-5005, fax Sees patients 1st and 3rd Saturday of every month.  Must not qualify for public or private insurance (i.e. Medicaid, Medicare, [place] Health Choice, Veterans' Benefits) • Household income should be no more than 200% of the poverty level •The clinic cannot treat you if you are pregnant or think you are pregnant • Sexually transmitted diseases are not treated at the clinic.  ° ° °Dental Care: °Organization         Address  Phone  Notes  °[place] [facility] [address], [place] (336) 641-6152 Accepts children up to age 21 who are enrolled in Medicaid or [place] Health Choice; pregnant women with a Medicaid card; and children who have applied for Medicaid or [place] Health Choice, but were declined, whose parents can pay a reduced fee at time of service.  °[place]  [facility] [place]  [address], [place] (336) 641-7733 Accepts children up to age 21 who are enrolled in Medicaid or [place] Health Choice; pregnant women with a Medicaid card; and children who have applied for Medicaid or [place] Health Choice, but were declined, whose parents can pay a reduced fee at time of service.  °[facility] ° [address], [place] (336) 641-4533 Patients are seen by appointment only. Walk-ins are not accepted. [facility] will see patients 18 years of age and older. °Monday - Tuesday (8am-5pm) °Most Wednesdays (8:30-5pm) °$30 per visit, cash only  °[facility] ° [address], [place] (336) 641-4533 Patients are seen by appointment only. Walk-ins are not accepted. [facility] will see patients 18 years of age and older. °One   Wednesday Evening (Monthly: Volunteer Based).  $30 per visit, cash only  °[facility]  (919) 537-3737 for adults; Children under age 4, call Graduate Pediatric Dentistry at ([phone]) [phone]. Children aged 4-14, please call ([phone]) [phone] to request a pediatric application. ° Dental services are provided in all areas of dental care including fillings, crowns and bridges, complete and partial dentures, implants, gum treatment, root canals, and extractions. Preventive care is also provided. Treatment is provided to both adults and children. °Patients are selected via a lottery and there is often a waiting list. °  °[facility] [address], °[place] ° (336) 763-8833 [email or web address] °  °[facility] [address], [place], [place] (336)723-1848, Ext. 123 Second and Fourth Thursday of each month, opens at 6:30 AM; Clinic ends at 9 AM.  Patients are seen on a first-come first-served basis, and a limited number are seen during each clinic.  ° °[facility] ° [address], [place], [place] (336) 723-7904    Eligibility Requirements °You must have lived in [place], or [place] counties for at least the last three months. °  You cannot be eligible for state or federal sponsored healthcare insurance, including Veterans Administration, Medicaid, or Medicare. °  You generally cannot be eligible for healthcare insurance through your employer.  °  How to apply: °Eligibility screenings are held every Tuesday and Wednesday afternoon from 1:00 pm until 4:00 pm. You do not need an appointment for the interview!  °[facility] [address], [place], [place] 336-631-2330   °[facility]  336-342-8273   °[facility]  336-703-3100   °[facility]  336-570-6415   ° °Behavioral Health Resources in the Community: °Intensive Outpatient Programs °Organization         Address  Phone  Notes  °[facility] [address], [place], [place] 336-878-6098   °[facility] [address], [place], [place] 336-832-9800   °[facility] [address], [place], [place] ° 336-882-2125   °[facility] [address],  °[place], [place] 1-800-853-5163 or 336-641-4981   °Substance Abuse Resources °Organization         Address  Phone  Notes  °[facility]  336-882-2125   °[facility]  336-784-9470   °[facility]  336-285-9073   °[facility]  336-845-3988   °Residential & Outpatient Substance Abuse Program  1-800-659-3381   °Psychological Services °Organization         Address  Phone  Notes  °[facility]  336- 832-9600   °[facility]  336- 378-7881   °[facility] [address], [place] 1-800-853-5163 or 336-641-4981   ° °Mobile Crisis Teams °Organization         Address  Phone  Notes  °[facility], Mobile Crisis Care Unit  1-877-626-1772   °Assertive °[facility] ° [address]  [place], [place] 336-834-9664   °Sharon DeEsch [address] °[place] 336-554-5454   ° °Self-Help/Support Groups °Organization         Address  Phone             Notes  °[facility]  - variety of support groups  336- 373-1402 Call for more information  °Narcotics Anonymous (NA), [facility] [address], °[place]  2 meetings at this location  ° °  Residential Treatment Programs Organization         Address  Phone  Notes  [facility] [address],    [place]  1-610-960-4540   [facility]  [address] [place] [address], [place], [place] [phone]   [facility] [address] [place], [place] 086-578-4696 Admissions: 8am-3pm M-F  [facility] [address]    [place], [place] 295-284-1324   [facility] [address] [place], [place], [place] 401-027-2536   [facility] [address],  [place], [place] [phone]   [facility] Intensive Outpatient [address], [place], [place] 956-387-5643   [facility] ([facility] [address] [place]  [place], [place] 3-295-188-4166 or 903-776-8611   [facility] [address], [place], [place] 323-557-3220 Accepts Medicaid  [facility] [address],  [place] 2-542-706-2376 Substance Abuse/Addiction Treatment   [facility]         Address  Phone  Notes  [facility]  531-288-2066   Angie Fava, PhD [address] [place], [place]   318-349-2316 or (405)227-9861   [facility]   [address] [place], [place] [phone]   [facility] [address] [place], [place] 269-104-1936 Insurance/Medicaid/sponsorship through [facility] Families [address]                                    [place], [place] [phone] Therapy/tele-psych/case    [facility] [address], [place] (251)[phone]    Dr. Lolly Mustache  ([phone]) [phone]   [facility]  [employer] [facility] Dept. 1) [address]  2) [address], [place] 3)  [address], [place] [phone]  [phone]   [facility] Child Abuse Hotline (213)625-6035 or (216) 867-0104 (After Hours)        Hypertension Hypertension is another name for high blood pressure. High blood pressure forces your heart to work harder to pump blood. A blood pressure reading has two numbers, which includes a higher number over a lower number (example: 110/72). HOME CARE   Have your blood pressure rechecked by your doctor.  Only take medicine as told by your doctor. Follow the directions carefully. The medicine does not work as well if you skip doses. Skipping doses also puts you at risk for problems.  Do not smoke.  Monitor your blood pressure at home as told by your doctor. GET HELP IF:  You think you are having a reaction to the medicine you are taking.  You have repeat headaches or feel dizzy.  You have puffiness (swelling) in your ankles.  You have trouble with your vision. GET HELP RIGHT AWAY IF:   You get a very bad headache and are confused.  You feel weak, numb, or faint.  You get chest or belly (abdominal) pain.  You throw up (vomit).  You cannot breathe very well. MAKE SURE YOU:   Understand these instructions.  Will watch your condition.  Will get help right away if you are not doing well or get worse.   This information is not intended to replace advice given to you by your health care provider. Make sure  you discuss any questions you have with your health care provider.   Document Released: 11/26/2007 Document Revised: 06/14/2013 Document Reviewed: 04/01/2013 Elsevier Interactive Patient Education 2016 Elsevier Inc. General Headache Without Cause A headache is pain  or discomfort felt around the head or neck area. There are many causes and types of headaches. In some cases, the cause may not be found.  HOME CARE  Managing Pain  Take over-the-counter and prescription medicines only as told by your doctor.  Lie down in a dark, quiet room when you have a headache.  If directed, apply ice to the head and neck area:  Put ice in a plastic bag.  Place a towel between your skin and the bag.  Leave the ice on for 20 minutes, 2-3 times per day.  Use a heating pad or hot shower to apply heat to the head and neck area as told by your doctor.  Keep lights dim if bright lights bother you or make your headaches worse. Eating and Drinking  Eat meals on a regular schedule.  Lessen how much alcohol you drink.  Lessen how much caffeine you drink, or stop drinking caffeine. General Instructions  Keep all follow-up visits as told by your doctor. This is important.  Keep a journal to find out if certain things bring on headaches. For example, write down:  What you eat and drink.  How much sleep you get.  Any change to your diet or medicines.  Relax by getting a massage or doing other relaxing activities.  Lessen stress.  Sit up straight. Do not tighten (tense) your muscles.  Do not use tobacco products. This includes cigarettes, chewing tobacco, or e-cigarettes. If you need help quitting, ask your doctor.  Exercise regularly as told by your doctor.  Get enough sleep. This often means 7-9 hours of sleep. GET HELP IF:  Your symptoms are not helped by medicine.  You have a headache that feels different than the other headaches.  You feel sick to your stomach (nauseous) or you throw up (vomit).  You have a fever. GET HELP RIGHT AWAY IF:   Your headache becomes really bad.  You keep throwing up.  You have a stiff neck.  You have trouble seeing.  You have trouble speaking.  You have pain in the eye or ear.  Your muscles are weak or  you lose muscle control.  You lose your balance or have trouble walking.  You feel like you will pass out (faint) or you pass out.  You have confusion.   This information is not intended to replace advice given to you by your health care provider. Make sure you discuss any questions you have with your health care provider.   Document Released: 03/18/2008 Document Revised: 02/28/2015 Document Reviewed: 10/02/2014 Elsevier Interactive Patient Education 2016 Elsevier Inc. Nonspecific Chest Pain It is often hard to find the cause of chest pain. There is always a chance that your pain could be related to something serious, such as a heart attack or a blood clot in your lungs. Chest pain can also be caused by conditions that are not life-threatening. If you have chest pain, it is very important to follow up with your doctor.  HOME CARE  If you were prescribed an antibiotic medicine, finish it all even if you start to feel better.  Avoid any activities that cause chest pain.  Do not use any tobacco products, including cigarettes, chewing tobacco, or electronic cigarettes. If you need help quitting, ask  your doctor.  Do not drink alcohol.  Take medicines only as told by your doctor.  Keep all follow-up visits as told by your doctor. This is important. This includes any further testing if your chest pain does not go away.  Your doctor may tell you to keep your head raised (elevated) while you sleep.  Make lifestyle changes as told by your doctor. These may include:  Getting regular exercise. Ask your doctor to suggest some activities that are safe for you.  Eating a heart-healthy diet. Your doctor or a diet specialist (dietitian) can help you to learn healthy eating options.  Maintaining a healthy weight.  Managing diabetes, if necessary.  Reducing stress. GET HELP IF:  Your chest pain does not go away, even after treatment.  You have a rash with blisters on your chest.  You  have a fever. GET HELP RIGHT AWAY IF:  Your chest pain is worse.  You have an increasing cough, or you cough up blood.  You have severe belly (abdominal) pain.  You feel extremely weak.  You pass out (faint).  You have chills.  You have sudden, unexplained chest discomfort.  You have sudden, unexplained discomfort in your arms, back, neck, or jaw.  You have shortness of breath at any time.  You suddenly start to sweat, or your skin gets clammy.  You feel nauseous.  You vomit.  You suddenly feel light-headed or dizzy.  Your heart begins to beat quickly, or it feels like it is skipping beats. These symptoms may be an emergency. Do not wait to see if the symptoms will go away. Get medical help right away. Call your local emergency services (911 in the [place] Do not drive yourself to the hospital.   This information is not intended to replace advice given to you by your health care provider. Make sure you discuss any questions you have with your health care provider.   Document Released: 11/26/2007 Document Revised: 06/30/2014 Document Reviewed: 01/13/2014 Elsevier Interactive Patient Education [employer]

## 2015-06-26 NOTE — ED Notes (Signed)
Headache since 06/18/15.  Checked BP today at [facility] and BP 180/90.  Rates pain 5/10.

## 2015-06-26 NOTE — ED Provider Notes (Signed)
CSN: [id number]     Arrival date & time 06/26/15  1337 History   First MD Initiated Contact with Patient 06/26/15 1654     Chief Complaint  Patient presents with  . Hypertension    [facility]  . Headache    x one week     (Consider location/radiation/quality/duration/timing/severity/associated sxs/prior Treatment) HPI  Is a 50 year old male who presents today stating he has had a headache for the past week. He went and had his [facility] checked [facility] and noted that was high. He came in secondary to that. He states that his blood pressure was 180/90. He states his headache is back and has 5 out of 10. He states also that he began having some chest pain today when he went out to his car. He describes it as sharp and in the middle of his chest has intermittent elbow pains lasted 20 minutes. There were no associated symptoms. Has not had any prior similar symptoms. He became nauseated at the time but did not vomit. He did not take any medications.  Past Medical History  Diagnosis Date  . Hemopneumothorax   . Jaw fracture (HCC)   . Skull fracture [facility])    Past Surgical History  Procedure Laterality Date  . Collapsed lung    . Chest tube insertion     Family History  Problem Relation Age of Onset  . Cancer    . Diabetes    . Heart attack    . Stroke     Social History  Substance Use Topics  . Smoking status: Never Smoker   . Smokeless tobacco: Current User    Types: Snuff  . Alcohol Use: Yes     Comment: occasional    Review of Systems  All other systems reviewed and are negative.     Allergies  Penicillins and Hydrocodone  Home Medications   Prior to Admission medications   Not on File   BP 132/95 mmHg  Pulse 74  Temp(Src) 98.8 F (37.1 C) (Temporal)  Resp 21  Ht 6\' 4"  (1.93 m)  Wt 102.513 kg  BMI 27.52 kg/m2  SpO2 100% Physical Exam  Constitutional: He is oriented to person, place, and time. He appears well-developed and  well-nourished.  HENT:  Head: Normocephalic and atraumatic.  Right Ear: External ear normal.  Left Ear: External ear normal.  Nose: Nose normal.  Mouth/Throat: Oropharynx is clear and moist.  Eyes: Conjunctivae and EOM are normal. Pupils are equal, round, and reactive to light.  Neck: Normal range of motion. Neck supple.  Cardiovascular: Normal rate, regular rhythm, normal heart sounds and intact distal pulses.   Pulmonary/Chest: Effort normal and breath sounds normal. No respiratory distress. He has no wheezes. He exhibits no tenderness.  Abdominal: Soft. Bowel sounds are normal. He exhibits no distension and no mass. There is no tenderness. There is no guarding.  Musculoskeletal: Normal range of motion.  Neurological: He is alert and oriented to person, place, and time. He has normal reflexes. He exhibits normal muscle tone. Coordination normal.  Skin: Skin is warm and dry.  Psychiatric: He has a normal mood and affect. His behavior is normal. Judgment and thought content normal.  Nursing note and vitals reviewed.   ED Course  Procedures (including critical care time) Labs Review Labs Reviewed  APTT  CBC  COMPREHENSIVE METABOLIC PANEL  PROTIME-INR  Rosezena Sensor, ED    Imaging Review Dg Chest Portable 1 View  06/26/2015  CLINICAL DATA:  50 year old male with a history of hypertension and headache. EXAM: PORTABLE CHEST 1 VIEW COMPARISON:  10/05/2009, CT abdomen 06/25/2011 FINDINGS: Cardiomediastinal silhouette unchanged in size and contour. No evidence of pulmonary vascular congestion. Coarsened interstitial markings. No pneumothorax or pleural effusion. No confluent airspace disease. No displaced fracture. IMPRESSION: No radiographic evidence of acute cardiopulmonary disease. Signed, Yvone NeuJaime S. Loreta AveWagner, DO [facility] Electronically Signed   By: Gilmer MorJaime  Wagner D.O.   On: 06/26/2015 17:50   I have personally reviewed and  evaluated these images and lab results as part of my medical decision-making.   EKG Interpretation   Date/Time:  Tuesday June 26 2015 17:13:52 EST Ventricular Rate:  66 PR Interval:  144 QRS Duration: 106 QT Interval:  418 QTC Calculation: 438 R Axis:   21 Text Interpretation:  Sinus rhythm RSR' in V1 or V2, right VCD or RVH  Confirmed by Prudencio Velazco MD, Duwayne HeckANIELLE [phone]([id number]) on 06/26/2015 6:15:51 PM      MDM   Final diagnoses:  Nonintractable headache, unspecified chronicity pattern, unspecified headache type  Chest pain, unspecified chest pain type    This is a 50 year old male who is complaining of headache that he thinks is associated with high blood pressure. Blood pressures here have ranged systolically from 123-149 diastolically from 77-95. His a normal neurological exam. I do not think that his headache is associated with hypertension. He also noted that he had some chest pain since morning. He is not currently having active pain. EKG shows no evidence of acute ischemia and troponin is normal and this occurred greater than 6 hours ago.. Patient is advised to follow-up closely. He is advised to return immediately if he has any return of chest pain. We have discussed return precautions and he voices understanding.    Margarita Grizzleanielle Avalynne Diver, MD 06/28/15 [phone]

## 2016-08-15 ENCOUNTER — Ambulatory Visit
Admission: AD | Admit: 2016-08-15 | Discharge: 2016-08-15 | Disposition: A | Discharge status: Home / Self Care | Payer: Worker's Compensation | Source: Ambulatory Visit | Attending: Family | Admitting: Family

## 2016-08-15 ENCOUNTER — Ambulatory Visit
Admission: RE | Admit: 2016-08-15 | Discharge: 2016-08-15 | Disposition: A | Discharge status: Home / Self Care | Payer: Worker's Compensation | Source: Ambulatory Visit | Attending: Family | Admitting: Family

## 2016-08-15 ENCOUNTER — Other Ambulatory Visit: Payer: Self-pay | Admitting: Family

## 2016-08-15 DIAGNOSIS — M5137 Other intervertebral disc degeneration, lumbosacral region: Secondary | ICD-10-CM | POA: Insufficient documentation

## 2016-08-15 DIAGNOSIS — T1490XA Injury, unspecified, initial encounter: Secondary | ICD-10-CM

## 2016-08-15 DIAGNOSIS — M545 Low back pain: Secondary | ICD-10-CM

## 2016-08-15 DIAGNOSIS — M25552 Pain in left hip: Secondary | ICD-10-CM | POA: Diagnosis present

## 2016-10-21 ENCOUNTER — Inpatient Hospital Stay (HOSPITAL_COMMUNITY)
Admission: EM | Admit: 2016-10-21 | Discharge: 2016-10-22 | DRG: 882 | Disposition: A | Discharge status: Home / Self Care | Payer: Self-pay | Attending: Internal Medicine | Admitting: Internal Medicine

## 2016-10-21 ENCOUNTER — Emergency Department (HOSPITAL_COMMUNITY): Payer: Self-pay

## 2016-10-21 ENCOUNTER — Encounter (HOSPITAL_COMMUNITY): Payer: Self-pay | Admitting: Emergency Medicine

## 2016-10-21 DIAGNOSIS — F458 Other somatoform disorders: Principal | ICD-10-CM | POA: Diagnosis present

## 2016-10-21 DIAGNOSIS — E785 Hyperlipidemia, unspecified: Secondary | ICD-10-CM | POA: Diagnosis present

## 2016-10-21 DIAGNOSIS — Z880 Allergy status to penicillin: Secondary | ICD-10-CM

## 2016-10-21 DIAGNOSIS — R51 Headache: Secondary | ICD-10-CM

## 2016-10-21 DIAGNOSIS — H5711 Ocular pain, right eye: Secondary | ICD-10-CM | POA: Diagnosis present

## 2016-10-21 DIAGNOSIS — Z720 Tobacco use: Secondary | ICD-10-CM | POA: Diagnosis present

## 2016-10-21 DIAGNOSIS — H547 Unspecified visual loss: Secondary | ICD-10-CM

## 2016-10-21 DIAGNOSIS — E538 Deficiency of other specified B group vitamins: Secondary | ICD-10-CM | POA: Diagnosis present

## 2016-10-21 DIAGNOSIS — G608 Other hereditary and idiopathic neuropathies: Secondary | ICD-10-CM | POA: Diagnosis present

## 2016-10-21 DIAGNOSIS — E042 Nontoxic multinodular goiter: Secondary | ICD-10-CM | POA: Diagnosis present

## 2016-10-21 DIAGNOSIS — Z833 Family history of diabetes mellitus: Secondary | ICD-10-CM

## 2016-10-21 DIAGNOSIS — H5461 Unqualified visual loss, right eye, normal vision left eye: Secondary | ICD-10-CM | POA: Diagnosis present

## 2016-10-21 DIAGNOSIS — Z885 Allergy status to narcotic agent status: Secondary | ICD-10-CM

## 2016-10-21 DIAGNOSIS — Z8249 Family history of ischemic heart disease and other diseases of the circulatory system: Secondary | ICD-10-CM

## 2016-10-21 DIAGNOSIS — H349 Unspecified retinal vascular occlusion: Secondary | ICD-10-CM | POA: Diagnosis present

## 2016-10-21 DIAGNOSIS — R519 Headache, unspecified: Secondary | ICD-10-CM | POA: Diagnosis present

## 2016-10-21 DIAGNOSIS — H53131 Sudden visual loss, right eye: Secondary | ICD-10-CM

## 2016-10-21 DIAGNOSIS — M5481 Occipital neuralgia: Secondary | ICD-10-CM | POA: Diagnosis present

## 2016-10-21 DIAGNOSIS — Z8619 Personal history of other infectious and parasitic diseases: Secondary | ICD-10-CM

## 2016-10-21 LAB — CBC WITH DIFFERENTIAL/PLATELET
BASOS ABS: 0.1 10*3/uL (ref 0.0–0.1)
BASOS PCT: 1 %
EOS PCT: 14 %
Eosinophils Absolute: 1.3 10*3/uL — ABNORMAL HIGH (ref 0.0–0.7)
HCT: 42.9 % (ref 39.0–52.0)
Hemoglobin: 14.6 g/dL (ref 13.0–17.0)
Lymphocytes Relative: 20 %
Lymphs Abs: 1.9 10*3/uL (ref 0.7–4.0)
MCH: 30.1 pg (ref 26.0–34.0)
MCHC: 34 g/dL (ref 30.0–36.0)
MCV: 88.5 fL (ref 78.0–100.0)
MONO ABS: 0.6 10*3/uL (ref 0.1–1.0)
Monocytes Relative: 6 %
Neutro Abs: 5.6 10*3/uL (ref 1.7–7.7)
Neutrophils Relative %: 59 %
PLATELETS: 220 10*3/uL (ref 150–400)
RBC: 4.85 MIL/uL (ref 4.22–5.81)
RDW: 12.6 % (ref 11.5–15.5)
WBC: 9.5 10*3/uL (ref 4.0–10.5)

## 2016-10-21 LAB — COMPREHENSIVE METABOLIC PANEL
ALBUMIN: 4.2 g/dL (ref 3.5–5.0)
ALT: 27 U/L (ref 17–63)
AST: 21 U/L (ref 15–41)
Alkaline Phosphatase: 102 U/L (ref 38–126)
Anion gap: 7 (ref 5–15)
BUN: 14 mg/dL (ref 6–20)
CHLORIDE: 102 mmol/L (ref 101–111)
CO2: 28 mmol/L (ref 22–32)
CREATININE: 0.94 mg/dL (ref 0.61–1.24)
Calcium: 9.3 mg/dL (ref 8.9–10.3)
GFR calc Af Amer: >60 mL/min (ref >60)
GLUCOSE: 94 mg/dL (ref 65–99)
POTASSIUM: 3.8 mmol/L (ref 3.5–5.1)
SODIUM: 137 mmol/L (ref 135–145)
Total Bilirubin: 0.8 mg/dL (ref 0.3–1.2)
Total Protein: 7.5 g/dL (ref 6.5–8.1)

## 2016-10-21 LAB — I-STAT CHEM 8, ED
BUN: 14 mg/dL (ref 6–20)
CALCIUM ION: 1.21 mmol/L (ref 1.15–1.40)
CHLORIDE: 102 mmol/L (ref 101–111)
Creatinine, Ser: 1 mg/dL (ref 0.61–1.24)
Glucose, Bld: 91 mg/dL (ref 65–99)
HEMATOCRIT: 46 % (ref 39.0–52.0)
Hemoglobin: 15.6 g/dL (ref 13.0–17.0)
Potassium: 3.7 mmol/L (ref 3.5–5.1)
Sodium: 140 mmol/L (ref 135–145)
TCO2: 27 mmol/L (ref 0–100)

## 2016-10-21 LAB — CBC
HEMATOCRIT: 44.5 % (ref 39.0–52.0)
HEMOGLOBIN: 15.2 g/dL (ref 13.0–17.0)
MCH: 29.7 pg (ref 26.0–34.0)
MCHC: 34.2 g/dL (ref 30.0–36.0)
MCV: 87.1 fL (ref 78.0–100.0)
Platelets: 216 10*3/uL (ref 150–400)
RBC: 5.11 MIL/uL (ref 4.22–5.81)
RDW: 12.5 % (ref 11.5–15.5)
WBC: 11.7 10*3/uL — ABNORMAL HIGH (ref 4.0–10.5)

## 2016-10-21 LAB — CREATININE, SERUM
Creatinine, Ser: 0.96 mg/dL (ref 0.61–1.24)
GFR calc Af Amer: >60 mL/min (ref >60)

## 2016-10-21 LAB — C-REACTIVE PROTEIN

## 2016-10-21 LAB — SEDIMENTATION RATE: SED RATE: 20 mm/h — AB (ref 0–16)

## 2016-10-21 MED ORDER — SODIUM CHLORIDE 0.9 % IV SOLN
INTRAVENOUS | Status: DC
  Administered 2016-10-21: 75 mL via INTRAVENOUS

## 2016-10-21 MED ORDER — ACETAMINOPHEN 325 MG PO TABS: 650.0000 mg | ORAL_TABLET | ORAL | Status: DC | PRN

## 2016-10-21 MED ORDER — IOPAMIDOL (ISOVUE-370) INJECTION 76%
75.0000 mL | Freq: Once | INTRAVENOUS | Status: AC | PRN
  Administered 2016-10-21: 75 mL via INTRAVENOUS

## 2016-10-21 MED ORDER — SENNOSIDES-DOCUSATE SODIUM 8.6-50 MG PO TABS: 1.0000 | ORAL_TABLET | Freq: Every evening | ORAL | Status: DC | PRN

## 2016-10-21 MED ORDER — ASPIRIN 81 MG PO CHEW: 324.0000 mg | CHEWABLE_TABLET | Freq: Once | ORAL | Status: DC

## 2016-10-21 MED ORDER — ASPIRIN 300 MG RE SUPP
300.0000 mg | Freq: Once | RECTAL | Status: AC
  Administered 2016-10-21: 300 mg via RECTAL
  Filled 2016-10-21: qty 1

## 2016-10-21 MED ORDER — ACETAMINOPHEN 650 MG RE SUPP: 650.0000 mg | RECTAL | Status: DC | PRN

## 2016-10-21 MED ORDER — TETRACAINE HCL 0.5 % OP SOLN
2.0000 [drp] | Freq: Once | OPHTHALMIC | Status: AC
  Administered 2016-10-21: 2 [drp] via OPHTHALMIC
  Filled 2016-10-21: qty 4

## 2016-10-21 MED ORDER — METHYLPREDNISOLONE SODIUM SUCC 125 MG IJ SOLR
125.0000 mg | Freq: Once | INTRAMUSCULAR | Status: AC
  Administered 2016-10-21: 125 mg via INTRAVENOUS
  Filled 2016-10-21: qty 2

## 2016-10-21 MED ORDER — ASPIRIN 300 MG RE SUPP: 300.0000 mg | Freq: Every day | RECTAL | Status: DC

## 2016-10-21 MED ORDER — ACETAMINOPHEN 160 MG/5ML PO SOLN: 650.0000 mg | ORAL | Status: DC | PRN

## 2016-10-21 MED ORDER — ENOXAPARIN SODIUM 40 MG/0.4ML ~~LOC~~ SOLN
40.0000 mg | SUBCUTANEOUS | Status: DC
  Administered 2016-10-21: 40 mg via SUBCUTANEOUS
  Filled 2016-10-21: qty 0.4

## 2016-10-21 MED ORDER — ASPIRIN 325 MG PO TABS
325.0000 mg | ORAL_TABLET | Freq: Every day | ORAL | Status: DC
  Administered 2016-10-22: 325 mg via ORAL
  Filled 2016-10-21: qty 1

## 2016-10-21 MED ORDER — STROKE: EARLY STAGES OF RECOVERY BOOK
Freq: Once | Status: AC
  Administered 2016-10-21: 20:00:00

## 2016-10-21 MED ORDER — GADOBENATE DIMEGLUMINE 529 MG/ML IV SOLN
20.0000 mL | Freq: Once | INTRAVENOUS | Status: AC | PRN
  Administered 2016-10-21: 20 mL via INTRAVENOUS

## 2016-10-21 NOTE — ED Notes (Signed)
Patient in MRI 

## 2016-10-21 NOTE — ED Provider Notes (Signed)
[facility] DEPT Provider Note   CSN: [id number] Arrival date & time: 10/21/16  9983 By signing my name below, I, Gaspar Cola, attest that this documentation has been prepared under the direction and in the presence of Merrily Pew, MD . Electronically Signed: Gaspar Cola Scribe. 10/21/2016. 9:30 AM History   Chief Complaint Chief Complaint  Patient presents with  . Loss of Vision     The history is provided by the patient. No language interpreter was used.     HPI: Jeff Schultz is a 51 y.o. male with a history of jaw and skull fractures who presents to the Emergency Department complaining of unchanged loss of vision in right eye onset this morning. He reports seeing total blackness when closing his left eye. Per pt, vision was fine last night prior to going to sleep. He reports associated right headache and right eye pain. He denies any left eye pain or left eye vision problems. He notes he is a tobacco chewer. He denies chronic medical conditions.    Past Medical History:  Diagnosis Date  . Hemopneumothorax   . Jaw fracture ([place])   . Skull fracture [facility])     Patient Active Problem List   Diagnosis Date Noted  . 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 CHEST TUBE INSERTION    . collapsed lung      Home Medications    Prior to Admission medications   Not on File   Family History Family History  Problem Relation Age of Onset  . Cancer    . Diabetes    . Heart attack    . Stroke     Social History Social History  Substance Use Topics  . Smoking status: Never Smoker  . Smokeless tobacco: Current User    Types: Snuff  . Alcohol use Yes     Comment: occasional    Allergies   Penicillins and Hydrocodone  Review of Systems Review of Systems  Eyes: Positive for pain and visual disturbance (vision loss).  Neurological: Positive for headaches.  All other systems reviewed and are  negative.    Physical Exam Updated Vital Signs BP 118/82   Pulse 65   Temp 97.4 F (36.3 C)   Resp 13   Ht '6\' 4"'$  (1.93 m)   Wt 220 lb (99.8 kg)   SpO2 96%   BMI 26.78 kg/m   Physical Exam  Constitutional: He is oriented to person, place, and time. He appears well-developed and well-nourished.  HENT:  Head: Normocephalic and atraumatic.  Eyes: Conjunctivae are normal. Pupils are equal, round, and reactive to light. Right eye exhibits abnormal extraocular motion. Left eye exhibits normal extraocular motion.  No vision in right eye. Not able to abduct the right eye. Left eye has normal visual fields, normal acuity and EOMI  Intraocular pressure: right 11,13 left 11,12 PERRLA  Neck: Normal range of motion. Neck supple.  Cardiovascular: Normal rate and regular rhythm.   Pulmonary/Chest: Effort normal. No respiratory distress.  Musculoskeletal: He exhibits no edema.  Neurological: He is alert and oriented to person, place, and time.  CN otherwise normal. Upper and lower extremity sensation and motor is equal and intact  Skin: Skin is warm and dry.  Psychiatric: He has a normal mood and affect. His behavior is normal.  Nursing note and vitals reviewed.    ED Treatments / Results  DIAGNOSTIC STUDIES: Oxygen Saturation is 97% RA, normal by my interpretation.  COORDINATION OF CARE:  9:11 AM Will order ultrasound of the head. Discussed treatment plan with pt at bedside and pt agreed to plan.  Labs (all labs ordered are listed, but only abnormal results are displayed) Labs Reviewed  SEDIMENTATION RATE - Abnormal; Notable for the following:       Result Value   Sed Rate 20 (*)    All other components within normal limits  CBC WITH DIFFERENTIAL/PLATELET - Abnormal; Notable for the following:    Eosinophils Absolute 1.3 (*)    All other components within normal limits  C-REACTIVE PROTEIN  COMPREHENSIVE METABOLIC PANEL  I-STAT CHEM 8, ED    EKG  EKG  Interpretation  Date/Time:  Tuesday Oct 21 2016 09:02:50 EDT Ventricular Rate:  74 PR Interval:    QRS Duration: 107 QT Interval:  413 QTC Calculation: 459 R Axis:   30 Text Interpretation:  Sinus rhythm RSR' in V1 or V2, right VCD or RV No significant change since last tracing Confirmed by [facility] MD, Corene Cornea [phone]) on 10/21/2016 10:59:24 AM       Radiology Ct Angio Head W Or Wo Contrast  Result Date: 10/21/2016 CLINICAL DATA:  Vision loss right eye, headache EXAM: CT ANGIOGRAPHY HEAD AND NECK TECHNIQUE: Multidetector CT imaging of the head and neck was performed using the standard protocol during bolus administration of intravenous contrast. Multiplanar CT image reconstructions and MIPs were obtained to evaluate the vascular anatomy. Carotid stenosis measurements (when applicable) are obtained utilizing NASCET criteria, using the distal internal carotid diameter as the denominator. CONTRAST:  75 mL Isovue 370 IV COMPARISON:  CT head 10/05/2009 FINDINGS: CT HEAD FINDINGS Brain: No evidence of acute abnormality, including acute infarct, hemorrhage, hydrocephalus, or mass lesion. Vascular: Negative for hyperdense vessel Skull: Negative Sinuses: Negative Orbits: Negative Review of the MIP images confirms the above findings CTA NECK FINDINGS Aortic arch: Normal aortic arch. Proximal great vessels widely patent. Right carotid system: Right carotid artery widely patent. Negative for dissection or atherosclerotic disease Left carotid system: Left carotid artery widely patent. Mild atherosclerotic noncalcified plaque in the proximal left internal carotid artery. Vertebral arteries: Normal Skeleton: Mild cervical spine degenerative change. No acute skeletal abnormality. Other neck: Thyroid goiter with multiple small nodules. The largest nodule is 8 mm in the right lower pole Upper chest: Lung apices clear. Review of the MIP images confirms the above findings CTA HEAD FINDINGS Anterior circulation: Cavernous  carotid widely patent bilaterally without atherosclerotic disease stenosis or aneurysm. Anterior and middle cerebral arteries widely patent. Posterior circulation: Both vertebral arteries widely patent to the basilar. PICA patent bilaterally. Basilar widely patent. Superior cerebellar and posterior cerebral arteries are normal. Venous sinuses: Normal venous enhancement with arachnoid granulations bilaterally. Anatomic variants: None Delayed phase: Normal Review of the MIP images confirms the above findings IMPRESSION: Mild atherosclerotic thickening of the proximal left internal carotid artery without significant stenosis. Otherwise negative CTA of the neck and head. Multinodular thyroid goiter. Electronically Signed   By: Franchot Gallo M.D.   On: 10/21/2016 10:50   Ct Angio Neck W And/or Wo Contrast  Result Date: 10/21/2016 CLINICAL DATA:  Vision loss right eye, headache EXAM: CT ANGIOGRAPHY HEAD AND NECK TECHNIQUE: Multidetector CT imaging of the head and neck was performed using the standard protocol during bolus administration of intravenous contrast. Multiplanar CT image reconstructions and MIPs were obtained to evaluate the vascular anatomy. Carotid stenosis measurements (when applicable) are obtained utilizing NASCET criteria, using the distal internal carotid diameter as the denominator. CONTRAST:  75 mL Isovue 370 IV COMPARISON:  CT head 10/05/2009 FINDINGS: CT HEAD FINDINGS Brain: No evidence of acute abnormality, including acute infarct, hemorrhage, hydrocephalus, or mass lesion. Vascular: Negative for hyperdense vessel Skull: Negative Sinuses: Negative Orbits: Negative Review of the MIP images confirms the above findings CTA NECK FINDINGS Aortic arch: Normal aortic arch. Proximal great vessels widely patent. Right carotid system: Right carotid artery widely patent. Negative for dissection or atherosclerotic disease Left carotid system: Left carotid artery widely patent. Mild atherosclerotic  noncalcified plaque in the proximal left internal carotid artery. Vertebral arteries: Normal Skeleton: Mild cervical spine degenerative change. No acute skeletal abnormality. Other neck: Thyroid goiter with multiple small nodules. The largest nodule is 8 mm in the right lower pole Upper chest: Lung apices clear. Review of the MIP images confirms the above findings CTA HEAD FINDINGS Anterior circulation: Cavernous carotid widely patent bilaterally without atherosclerotic disease stenosis or aneurysm. Anterior and middle cerebral arteries widely patent. Posterior circulation: Both vertebral arteries widely patent to the basilar. PICA patent bilaterally. Basilar widely patent. Superior cerebellar and posterior cerebral arteries are normal. Venous sinuses: Normal venous enhancement with arachnoid granulations bilaterally. Anatomic variants: None Delayed phase: Normal Review of the MIP images confirms the above findings IMPRESSION: Mild atherosclerotic thickening of the proximal left internal carotid artery without significant stenosis. Otherwise negative CTA of the neck and head. Multinodular thyroid goiter. Electronically Signed   By: Franchot Gallo M.D.   On: 10/21/2016 10:50   Mr Jeri Cos And Wo Contrast  Result Date: 10/21/2016 CLINICAL DATA:  51 year old male with loss of vision in the right eye for 1 day. Headache. EXAM: MRI HEAD WITHOUT AND WITH CONTRAST TECHNIQUE: Multiplanar, multiecho pulse sequences of the brain and surrounding structures were obtained without and with intravenous contrast. CONTRAST:  3m MULTIHANCE GADOBENATE DIMEGLUMINE 529 MG/ML IV SOLN COMPARISON:  CTA head and neck 1011 hours today. FINDINGS: Brain: No restricted diffusion to suggest acute infarction. No midline shift, mass effect, evidence of mass lesion, ventriculomegaly, extra-axial collection or acute intracranial hemorrhage. Cervicomedullary junction and pituitary are within normal limits. Cerebral volume is within normal limits  for age. GPearline Cablesand white matter signal is within normal limits for age throughout the brain; minimal nonspecific small foci of cerebral white matter T2 and FLAIR hyperintensity (series 12, image 13). No cortical encephalomalacia. No chronic cerebral blood products. No abnormal enhancement identified. No dural thickening. Vascular: Major intracranial vascular flow voids are preserved. Skull and upper cervical spine: Negative. Visualized bone marrow signal is within normal limits. Sinuses/Orbits: Optic chiasm appears normal. The optic nerves appear symmetric and within normal limits on these routine brain protocol images. The other orbits soft tissues appear normal. No optic radiation or occipital lobe signal abnormality identified. Visualized paranasal sinuses and mastoids are stable and well pneumatized. Other: Visible internal auditory structures appear normal. Negative scalp soft tissues. IMPRESSION: 1. Normal for age MRI appearance of the brain. 2. No orbit or optic pathway abnormality identified to explain right side vision loss. If there is a discrete vision related cranial neuropathy then a dedicated Orbit MRI without and with contrast would be recommended. Electronically Signed   By: HGenevie AnnM.D.   On: 10/21/2016 11:40    Procedures Procedures (including critical care time)  EMERGENCY DEPARTMENT [place] EXAM "Study: Limited Ultrasound of Orbit "  INDICATIONS: Vision loss Linear probe utilized to obtain images in both long and short axis of the orbit having the patient look left and right if possible.  PERFORMED  BY: Myself IMAGES ARCHIVED?: Yes LIMITATIONS: none VIEWS USED: Right orbit and Left orbit INTERPRETATION: No retinal detachment, Lens in proper position, Normal optic nerve diameter     Medications Ordered in ED Medications  tetracaine (PONTOCAINE) 0.5 % ophthalmic solution 2 drop (2 drops Right Eye Given by Other 10/21/16 0929)  iopamidol (ISOVUE-370) 76 % injection 75 mL (75  mLs Intravenous Contrast Given 10/21/16 1011)  gadobenate dimeglumine (MULTIHANCE) injection 20 mL (20 mLs Intravenous Contrast Given 10/21/16 1106)  methylPREDNISolone sodium succinate (SOLU-MEDROL) 125 mg/2 mL injection 125 mg (125 mg Intravenous Given 10/21/16 1413)  aspirin suppository 300 mg (300 mg Rectal Given 10/21/16 1601)     Initial Impression / Assessment and Plan / ED Course  I have reviewed the triage vital signs and the nursing notes.  Pertinent labs & imaging results that were available during my care of the patient were reviewed by me and considered in my medical decision making (see chart for details).     After multiple conversations with neurology, medicine and ophthalmology suspect the patient has a possible retinal artery occlusion versus giant cell arteritis. ESR slightly elevated side is started on steroids. Also given aspirin. Plan will be for admission to [facility] and evaluation by ophthalmology, Dr. Manuella Ghazi, [phone], and neurology. Aspirin given here. Unknown last known normal so will not consider TPA.   Final Clinical Impressions(s) / ED Diagnoses   Final diagnoses:  Vision loss   I personally performed the services described in this documentation, which was scribed in my presence. The recorded information has been reviewed and is accurate.     Merrily Pew, MD 10/21/16 [phone]

## 2016-10-21 NOTE — ED Notes (Signed)
Upon entering room to perform hourly round pt has multiple empty sleeves of crackers and is drinking. Pt made aware he failed his stroke swallow screen and is to be kept NPO. Pt states "The doctor gave it to me". Again pt made aware he is to be kept NPO as a result of failing stroke swallow screen.

## 2016-10-21 NOTE — Progress Notes (Signed)
Pt arrived safely on the unit, orders reviewed and verified, hand over report given to night shift nurse.
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
## 2016-10-21 NOTE — Consult Note (Signed)
Neurology Consult Note  Reason for Consultation: Vision loss right eye  Requesting provider: Raiford Noble, MD  CC: Unable to see in right eye  HPI: This is a 51 year old right-handed man who presented to [facility] after he woke up this morning unable to see from his right eye. He states that vision in that eye is completely black. He was fine when he went to bed last night. Vision loss has persisted and is unchanged over the course of the day. He denies any double vision. He has not had increased difficulty talking or swallowing. He has no jaw claudication. He denies any weakness or numbness in the extremities. He has not had any problems with coordination. He has not had any balance problems or gait disturbances. He denies any vertigo, nausea, vomiting. He has had an intermittent right-sided headache but denies increased pain with eye movements. He denies any eye pain.   At the outside hospital, he had MRI scan of the brain which showed no evidence of acute ischemic infarction. He had CT angiography of the head and neck which were unrevealing. He was given a dose of Solu-Medrol 125 mg IV due to apparent concern that he may have giant cell arteritis based upon an ESR of 20. He is now been transferred [facility]y and ophthalmology consultations.  The patient tells me that he has never had any similar symptoms before. However, family arrived at the bedside as I was leaving. They report that he had a transient episode of vision loss 3 or 4 months ago. This also occurred when he first woke up in the morning. Symptoms resolved after an unspecified amount of time. They thought little of it, attributed to "morning eyes." Family reports that he does not see a physician and he doesn't take any medications. He has multiple family members with diabetes and heart disease.  PMH:  Past Medical History:  Diagnosis Date  . Hemopneumothorax   . Jaw fracture ([place])   . Skull  fracture ([facility])     PSH:  Past Surgical History:  Procedure Laterality Date  . CHEST TUBE INSERTION    . collapsed lung      Family history: Family History  Problem Relation Age of Onset  . Cancer    . Diabetes    . Heart attack    . Stroke      Social history:  Social History   Social History  . Marital status: Married    Spouse name: N/A  . Number of children: 1  . Years of education: N/A   Occupational History  . truck driver    Social History Main Topics  . Smoking status: Never Smoker  . Smokeless tobacco: Current User    Types: Snuff  . Alcohol use Yes     Comment: occasional  . Drug use: No  . Sexual activity: Not on file   Other Topics Concern  . Not on file   Social History Narrative  . No narrative on file    Allergies: Allergies  Allergen Reactions  . Penicillins Anaphylaxis    Has patient had a PCN reaction causing immediate rash, facial/tongue/throat swelling, SOB or lightheadedness with hypotension: Yes Has patient had a PCN reaction causing severe rash involving mucus membranes or skin necrosis: Yes Has patient had a PCN reaction that required hospitalization No Has patient had a PCN reaction occurring within the last 10 years: No If all of the above answers are "NO", then may  proceed with Cephalosporin use.   Marland Kitchen Hydrocodone Itching    ROS: As per HPI. A full 14-point review of systems was performed and is otherwise unremarkable.   PE:  BP 133/79 (BP Location: Right Arm)   Pulse 73   Temp 97.7 F (36.5 C) (Oral)   Resp 18   Ht '6\' 4"'$  (1.93 m)   Wt 95.2 kg (209 lb 12.8 oz)   SpO2 98%   BMI 25.54 kg/m   General: WDWN, no acute distress. AAO x4. Speech clear, no dysarthria. No aphasia. Follows commands briskly. Affect is bright with congruent mood. Comportment is normal.  HEENT: Normocephalic. Neck supple without LAD. MMM, OP clear. Dentition good. Sclerae anicteric. No conjunctival injection.  CV: Regular, no murmur. Carotid pulses  full and symmetric, no bruits. Distal pulses 2+ and symmetric.  Lungs: CTAB.  Abdomen: Soft, non-distended, non-tender. Bowel sounds present x4.  Extremities: No C/C/E. Neuro:  CN: Pupils are equal and round. They are symmetrically reactive from 3-->2 mm. no afferent pupillary defect. He reports inability to see anything out of the right eye. Visual fields are full in the left eye. EOMI without nystagmus. No reported diplopia. Facial sensation is intact to light touch. Face is symmetric at rest with normal strength and mobility. Hearing is intact to conversational voice. Palate elevates symmetrically and uvula is midline. Voice is normal in tone, pitch and quality. Bilateral SCM and trapezii are 5/5. Tongue is midline with normal bulk and mobility.  Motor: Normal bulk, tone, and strength. No tremor or other abnormal movements. No drift.  Sensation: He reports inconsistent changes in both light touch and pinprick on the right side. These are difficult to localize. He seems to have decreased pinprick in both lower extremities, however.  DTRs: Trace in the arms, absent both legs. Toes downgoing bilaterally. No pathologic reflexes.  Coordination: Finger-to-nose and heel-to-shin are without dysmetria. Finger taps are normal in amplitude and speed, no decrement.    Labs:  Lab Results  Component Value Date   WBC 9.5 10/21/2016   HGB 15.6 10/21/2016   HCT 46.0 10/21/2016   PLT 220 10/21/2016   GLUCOSE 91 10/21/2016   ALT 27 10/21/2016   AST 21 10/21/2016   NA 140 10/21/2016   K 3.7 10/21/2016   CL 102 10/21/2016   CREATININE 1.00 10/21/2016   BUN 14 10/21/2016   CO2 28 10/21/2016   INR 0.94 06/26/2015   ESR 20 CRP <0.8 TSH pending fT4, fT3 pending Hgb a1c pending Lipid panel pending HIV pending  Imaging:  I have personally and independently reviewed the MRI scan of the brain without contrast from today. This shows no restricted diffusion that would suggest an acute ischemic stroke. He  has a very mild burden of chronic small vessel ischemic change involving the bihemispheric white matter. Intravascular flow voids appear normal. Ventricles are normal in size and configuration.  I have personally and independently reviewed the CT angiogram of the head and neck from today. He has some slight atherosclerotic change involving the proximal left internal carotid artery that results in no significant stenosis. His vessels were otherwise unremarkable.  Assessment and Plan:  1. Acute vision loss, right eye: History as reported is most concerning for retinal artery occlusion with AION. Ophthalmology consultation is noted and on their exam no evidence of structural pathology was identified that would explain his complaints. He felt that his symptoms are largely subjective with no ophthalmologic explanation and recommended outpatient follow-up, no further workup. Given absence of  additional neurologic findings as well as absence of acute ischemia on MRI brain, I find no neurologic explanation for his presentation. This raises concern for possible somatization disorder. Follow exam.  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 Check hemoglobin A1c to evaluate for possible diabetes. I will also check vitamin B12, folate, and RPR. TSH has been ordered and is pending. This does not appear to be pain. No pharmacologic intervention is necessary. Follow exam.  Thank you for this consultation. Neurology will continue to follow. Call with questions or concerns.

## 2016-10-21 NOTE — ED Triage Notes (Signed)
Pt reports waking this morning with no vision in right eye.  Also c/o headache.  No other neuro sx.

## 2016-10-21 NOTE — Consult Note (Signed)
CC:  Chief Complaint  Patient presents with  . Loss of Vision    HPI: Jeff Schultz is a 51 y.o. male w/ POH of RE and PMH below who presents for evaluation of sudden vision loss in the RIGHT eye. Symptoms onset this AM upon awakening around 6:30 AM. Couldn't see anything. Reports seeing no light out of his right eye. Rubbing eye last night per wife. No pain. ? Headache earlier. No diplopia. No changes in vision in left eye. No other symptoms like numbness, weakness, or tingling.   ROS: Denies fever/chills, unintentional weight loss, chest pain, irregular heart rhythm, SOB, cough, wheezing, abdominal pain, melena, hematochezia, weakness, numbness, slurring of speech, facial droop, muscle weakness, skin rash, depressed mood. + tatoos - on ankle, not new. + elbow pain.   PMH: Past Medical History:  Diagnosis Date  . Hemopneumothorax   . Jaw fracture (HCC)   . Skull fracture (HCC)     PSH: Past Surgical History:  Procedure Laterality Date  . CHEST TUBE INSERTION    . collapsed lung      Meds: No current facility-administered medications on file prior to encounter.    No current outpatient prescriptions on file prior to encounter.    SH: Social History   Social History  . Marital status: Married    Spouse name: N/A  . Number of children: 1  . Years of education: N/A   Occupational History  . truck driver    Social History Main Topics  . Smoking status: Never Smoker  . Smokeless tobacco: Current User    Types: Snuff  . Alcohol use Yes     Comment: occasional  . Drug use: No  . Sexual activity: Not Asked   Other Topics Concern  . None   Social History Narrative  . None    FH: Family History  Problem Relation Age of Onset  . Cancer    . Diabetes    . Heart attack    . Stroke      Exam:  Zenaida Niece: OD: patient reports NLP vision (pupil reactive to light) OS: 10 pt cc reading glasses  CVF: OD: NLP OS: full  EOM: OD: full d/v (no evidence of CN  VI palsy) OS: full d/v  Pupils: OD: 3->2 mm, no APD OS: 3->2 mm, no APD  IOP: by [place] OD:18 OS: 16  External: OD: no periorbital edema, no proptosis, V1-V3 intact and symmetric, good orbicularis strength OS: no periorbital edema, no proptosis, V1-V3 intact and symmetric, good orbicularis strength   Pen Light Exam: L/L: OD: WNL OS: WNL  C/S: OD: white and quiet OS: white and quiet  K: OD: clear, no abnormal staining OS: clear, no abnormal staining  A/C: OD: ? Shallow appearing by pen light OS: ? Shallow appearing by by pen light  I: OD: round and regular OS: round and regular  L: OD: NSC OS: NSC  DFE: dilated @ 6:35 PM w/ Tropic and Phenyl OD - deferred OS due to suspicion for narrow angle OS  V: OD: clear OS: clear  N: OD: C/D 0.1, no disc edema OS: C/D 0.1, no disc edema  M: OD: flat, no obvious macular pathology OS: unable to view due to deferring dilation  V: OD: normal appearing vessels OS: unable to view due to deferring dilation  P: OD: retina flat 360, no obvious mass/RT/RD OS: unable to view due to deferring dilation   Imaging:  MRI Brain w/ and w/o contrast: 1. Normal  for age MRI appearance of the brain. 2. No orbit or optic pathway abnormality identified to explain right side vision loss. If there is a discrete vision related cranial neuropathy then a dedicated Orbit MRI without and with contrast would be recommended.  CT Angio Head and Neck Impression: Mild atherosclerotic thickening of the proximal left internal carotid artery without significant stenosis. Otherwise negative CTA of the neck and head. Multinodular thyroid goiter.  A/P:  1. No Light Perception Vision Right Eye: ? Functional vision loss - Patient w/ subjective NLP vision OD but one examination right pupils is reactive to light which contradicts subjective complain - Discussed with patient subjective impairment in vision not corresponding with anything seen on  exam - Discussed no evidence of central retinal artery occlusion or any other pathology to explain NLP vision - Discussed exam findings reassuring and recommend further follow-up for outpatient evaluation in eye clinic - Provided reassurance that likely will improve with time as not obvious pathology or cause of permanent vision seen - MRI and CT findings reassuring  Dispo: - Discharge per primary team  - FU w/ [facility] w/ Dr. Georges Mouse upon discharge  [place] T. Sherryll Burger, MD [facility] Office: [phone]

## 2016-10-22 ENCOUNTER — Other Ambulatory Visit (HOSPITAL_COMMUNITY): Payer: Self-pay

## 2016-10-22 DIAGNOSIS — M5481 Occipital neuralgia: Secondary | ICD-10-CM

## 2016-10-22 DIAGNOSIS — G608 Other hereditary and idiopathic neuropathies: Secondary | ICD-10-CM | POA: Diagnosis present

## 2016-10-22 DIAGNOSIS — E538 Deficiency of other specified B group vitamins: Secondary | ICD-10-CM

## 2016-10-22 LAB — RAPID URINE DRUG SCREEN, HOSP PERFORMED
Amphetamines: NOT DETECTED
BARBITURATES: NOT DETECTED
Benzodiazepines: NOT DETECTED
COCAINE: NOT DETECTED
Opiates: NOT DETECTED
TETRAHYDROCANNABINOL: NOT DETECTED

## 2016-10-22 LAB — LIPID PANEL
CHOL/HDL RATIO: 5.8 ratio
CHOLESTEROL: 203 mg/dL — AB (ref 0–200)
HDL: 35 mg/dL — ABNORMAL LOW (ref >40)
LDL Cholesterol: 152 mg/dL — ABNORMAL HIGH (ref 0–99)
TRIGLYCERIDES: 78 mg/dL (ref <150)
VLDL: 16 mg/dL (ref 0–40)

## 2016-10-22 LAB — VITAMIN B12: Vitamin B-12: 218 pg/mL (ref 180–914)

## 2016-10-22 LAB — RPR: RPR: NONREACTIVE

## 2016-10-22 LAB — HIV ANTIBODY (ROUTINE TESTING W REFLEX): HIV SCREEN 4TH GENERATION: NONREACTIVE

## 2016-10-22 LAB — FOLATE: Folate: 11.9 ng/mL (ref >5.9)

## 2016-10-22 LAB — TSH: TSH: 0.308 u[IU]/mL — ABNORMAL LOW (ref 0.350–4.500)

## 2016-10-22 MED ORDER — CYANOCOBALAMIN 1000 MCG PO TABS: 1000.0000 ug | ORAL_TABLET | Freq: Every day | ORAL | 3 refills | Status: DC

## 2016-10-22 MED ORDER — VITAMIN B-12 1000 MCG PO TABS
1000.0000 ug | ORAL_TABLET | Freq: Every day | ORAL | Status: DC
  Administered 2016-10-22: 1000 ug via ORAL
  Filled 2016-10-22: qty 1

## 2016-10-22 MED ORDER — IBUPROFEN 200 MG PO TABS
800.0000 mg | ORAL_TABLET | Freq: Three times a day (TID) | ORAL | Status: DC
  Administered 2016-10-22: 800 mg via ORAL
  Filled 2016-10-22: qty 4

## 2016-10-22 MED ORDER — IBUPROFEN 800 MG PO TABS: 800.0000 mg | ORAL_TABLET | Freq: Three times a day (TID) | ORAL | 0 refills | Status: AC

## 2016-10-22 MED ORDER — CYANOCOBALAMIN 1000 MCG/ML IJ SOLN
1000.0000 ug | Freq: Once | INTRAMUSCULAR | Status: AC
  Administered 2016-10-22: 1000 ug via INTRAMUSCULAR
  Filled 2016-10-22: qty 1

## 2016-10-22 NOTE — Progress Notes (Signed)
Patient expressed that he pulled off several ticks a few weeks ago and just wanted staffs to be aware. Dr. Roxy Manns (Neurologist) at bedside at this time.  No fever noted. No other complaints at this time except right eye vision loss.   Sim Boast, RN

## 2016-10-22 NOTE — Evaluation (Signed)
Physical Therapy Evaluation Patient Details Name: Jeff Schultz MRN: [id number] DOB: [birth year] Today's Date: 10/22/2016   History of Present Illness  Pt is a 51 y/o male admitted secondary to acute loss of vision in R eye. All imaging has been negative. Per neurology's most recent note, physical exam findings are not corresponding to pt's subject reports. Neurology attributing pt's presentation to possible somatoform disorder. PMH including but not limited to tobacco abuse.  Clinical Impression  Pt presented supine in bed with HOB elevated, awake and willing to participate in therapy session. Prior to admission, pt reported that he was independent with all functional mobility and ADLs. 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 Pt able to ambulate in hallway with supervision, no AD and participated in higher level balance assessment (DGI - see below for details). Pt's vision was tested with difficulty tracking towards his R with both eyes engaged, stating that he could no longer see visual stimulus. However, when pt covered his L eye he was able to track therapist's movements with R eye even though he stated that he could not see anything. Pt with no difficulties ambulating in hallways or navigating around obstacles on either side. No further acute PT needs identified at this time. PT signing off.    Follow Up Recommendations No PT follow up    Equipment Recommendations  None recommended by PT    Recommendations for Other Services       Precautions / Restrictions Precautions Precautions: None Precaution Comments: ?R eye visual loss Restrictions Weight Bearing Restrictions: No      Mobility  Bed Mobility Overal bed mobility: Modified Independent                Transfers Overall transfer level: Modified independent Equipment used: None                Ambulation/Gait Ambulation/Gait assistance: Supervision Ambulation Distance (Feet): 300  Feet Assistive device: None Gait Pattern/deviations: Step-through pattern;Drifts right/left Gait velocity: WFL Gait velocity interpretation: at or above normal speed for age/gender General Gait Details: mild instability but no LOB or need for physical assistance, supervision for safety  Stairs            Wheelchair Mobility    Modified Rankin (Stroke Patients Only)       Balance Overall balance assessment: Needs assistance Sitting-balance support: Feet supported Sitting balance-Leahy Scale: Normal     Standing balance support: During functional activity;No upper extremity supported Standing balance-Leahy Scale: Fair                               Pertinent Vitals/Pain Pain Assessment: No/denies pain    Home Living Family/patient expects to be discharged to:: Private residence Living Arrangements: Spouse/significant other;Children Available Help at Discharge: Family;Available 24 hours/day Type of Home: House Home Access: Stairs to enter Entrance Stairs-Rails: None Entrance Stairs-Number of Steps: 1 Home Layout: One level Home Equipment: None      Prior Function Level of Independence: Independent         Comments: 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    Lower Extremity Assessment Lower Extremity Assessment: RLE deficits/detail  RLE Deficits / Details: Strength is WNL throughout. Pt with reports of "crawling" sensation throughout R LE with light touch input.       Communication   Communication: No difficulties  Cognition Arousal/Alertness: Awake/alert Behavior During Therapy: WFL for tasks assessed/performed Overall Cognitive Status: Within Functional  Limits for tasks assessed                                 General Comments: pt alert and oriented, appropriately following commands throughout evaluation      General Comments General comments (skin integrity, edema, etc.): visual tracking tested: pt with difficulty tracking towards his R side when both eyes are engaged; however, when L eye is covered pt able to visually track therapist's movements even though he reports that he cannot see anything.    Exercises     Assessment/Plan    PT Assessment Patent does not need any further PT services  PT Problem List         PT Treatment Interventions      PT Goals (Current goals can be found in the Care Plan section)  Acute Rehab PT Goals Patient Stated Goal: return home    Frequency     Barriers to discharge        Co-evaluation               AM-PAC PT "6 Clicks" Daily Activity  Outcome Measure Difficulty turning over in bed (including adjusting bedclothes, sheets and blankets)?: None Difficulty moving from lying on back to sitting on the side of the bed? : None Difficulty sitting down on and standing up from a chair with arms (e.g., wheelchair, bedside commode, etc,.)?: None Help needed moving to and from a bed to chair (including a wheelchair)?: None Help needed walking in hospital room?: A Little Help needed climbing 3-5 steps with a railing? : A Little 6 Click Score: 22    End of Session Equipment Utilized During Treatment: Gait belt Activity Tolerance: Patient tolerated treatment well Patient left: in bed;with call bell/phone within reach;Other (comment) (sitting EOB) Nurse Communication: Mobility status PT Visit Diagnosis: Other abnormalities of gait and mobility (R26.89);Other symptoms and signs involving the nervous system (R29.898)    Time: 1610-9604 PT Time Calculation (min) (ACUTE ONLY): 20 min   Charges:   PT Evaluation $PT Eval Moderate Complexity: 1 Procedure     PT G Codes:         Deborah Chalk, PT, DPT [phone]   Alessandra Bevels Allannah Kempen 10/22/2016, 12:09 PM

## 2016-10-22 NOTE — Progress Notes (Signed)
Neurology Progress Note  Subjective: Patient reports that his vision is unchanged in his R eye and that everything remains black there this morning. He reports that he has occasional discomfort in his R eye, describes that it feels like he has been jabbed in the eye at times. He also continues to have a headache located in the right occipital and temporal region, no nausea, photophobia, or phonophobia. He denies any other neurologic symptoms including numbness, weakness, trouble talking, trouble swallowing, or difficulties with coordination. He is hungry and wants to eat--he was made NPO after he reportedly failed his bedside swallow eval in the ED at [facility] [place], though it sounds like he was given crackers to eat with a dry mouth. He was able to eat and drink in the ED afterwards without difficulty--says that the doctor gave him crackers and a drink. 12-point ROS o/w negative.   He states that he had an episode a couple of months ago where he woke up and noted vision loss in the right eye, much like his current presentation. His vision returned after about ten minutes, however, and he did not seek medical attention. He also reports that he had several ticks that were attached to him a few weeks ago. He did not experience rash, headache, or any other symptoms at the time or in the days afterwards. He reports a h/o [facility] Spotted Fever in the past.   Medications reviewed and reconciled.   Pertinent meds: Aspirin 325 mg daily  Current Meds:   Current Facility-Administered Medications:  .  0.9 %  sodium chloride infusion, , Intravenous, Continuous, [employer], DO, Last Rate: 75 mL/hr at 10/21/16 1932, 75 mL at 10/21/16 1932 .  acetaminophen (TYLENOL) tablet 650 mg, 650 mg, Oral, Q4H PRN **OR** acetaminophen (TYLENOL) solution 650 mg, 650 mg, Per Tube, Q4H PRN **OR** acetaminophen (TYLENOL) suppository 650 mg, 650 mg, Rectal, Q4H PRN, Heloise Beecham Sheikh, DO .  aspirin suppository 300 mg,  300 mg, Rectal, Daily **OR** aspirin tablet 325 mg, 325 mg, Oral, Daily, Omair Latif Sheikh, DO .  enoxaparin (LOVENOX) injection 40 mg, 40 mg, Subcutaneous, Q24H, Omair Latif Sheikh, DO, 40 mg at 10/21/16 1935 .  senna-docusate (Senokot-S) tablet 1 tablet, 1 tablet, Oral, QHS PRN, Heloise Beecham Sheikh, DO  Objective:  Temp:  [97.4 F (36.3 C)-98 F (36.7 C)] 97.8 F (36.6 C) (05/02 0600) Pulse Rate:  [56-83] 75 (05/02 0600) Resp:  [7-18] 18 (05/02 0600) BP: (114-133)/(60-91) 127/70 (05/02 0600) SpO2:  [94 %-100 %] 95 % (05/02 0600) Weight:  [95.2 kg (209 lb 12.8 oz)-99.8 kg (220 lb)] 95.2 kg (209 lb 12.8 oz) (05/01 1810)  General: WDWN Caucasian man lying in bed in NAD. Alert, oriented x4. Speech is clear without dysarthria. Affect is bright. Comportment is normal.  HEENT: Neck is supple without lymphadenopathy. He has dysesthesia and pain with palpation of the R greater occipital nerve which reproduces his headache. He also has some muscle tension in the R trapezius. Mucous membranes are moist and the oropharynx is clear. Sclerae are anicteric. There is no conjunctival injection.  CV: Regular, no murmur. Carotid pulses are 2+ and symmetric with no bruits. Distal pulses 2+ and symmetric.  Lungs: CTAB  Extremities: No C/C/E. Neuro: MS: As noted above.  CN: Pupils are equal and reactive from 3-->2 mm bilaterally. No APD. He reports that he sees only black in the R eye but clearly blinks to visual threat. EOMI, no nystagmus. There is the suggestion of mild  latent strabismus in the R eye. Facial sensation is decreased to light touch on the right. Face is symmetric at rest with normal strength and mobility. Hearing is intact to conversational voice. Voice is normal in tone and quality. Palate elevates symmetrically. Uvula is midline. Bilateral SCM and trapezii are 5/5. Tongue is midline with normal bulk and mobility.  Motor: Normal bulk, tone, and strength throughout. No pronator drift. No tremor or  other abnormal movements are observed.  Sensation: Decreased to light touch on the right side. He has absent vibration in both great toes with diminished vibratory sense to just above the ankles. Joint position is impaired in both great toes. DTRs: 2+, symmetric in the arms, absent in the legs. Toes are downgoing bilaterally. No pathological reflexes.  Coordination: Finger-to-nose and heel-to-shin are without dysmetria bilaterally. He consistently misses his nose with the R hand but touches the same spot on his right cheek with each repetition.    Labs: Lab Results  Component Value Date   WBC 11.7 (H) 10/21/2016   HGB 15.2 10/21/2016   HCT 44.5 10/21/2016   PLT 216 10/21/2016   GLUCOSE 91 10/21/2016   CHOL 203 (H) 10/22/2016   TRIG 78 10/22/2016   HDL 35 (L) 10/22/2016   LDLCALC 152 (H) 10/22/2016   ALT 27 10/21/2016   AST 21 10/21/2016   NA 140 10/21/2016   K 3.7 10/21/2016   CL 102 10/21/2016   CREATININE 0.96 10/21/2016   BUN 14 10/21/2016   CO2 28 10/21/2016   INR 0.94 06/26/2015   CBC Latest Ref Rng & Units 10/21/2016 10/21/2016 10/21/2016  WBC 4.0 - 10.5 K/uL 11.7(H) - 9.5  Hemoglobin 13.0 - 17.0 g/dL 16.1 09.6 04.5  Hematocrit 39.0 - 52.0 % 44.5 46.0 42.9  Platelets 150 - 400 K/uL 216 - 220    No results found for: HGBA1C Lab Results  Component Value Date   ALT 27 10/21/2016   AST 21 10/21/2016   ALKPHOS 102 10/21/2016   BILITOT 0.8 10/21/2016   B12 218 Folate 11.9 Hgb a1c pending RPR pending  HIV nonreactive   Radiology:  There is no new neuroimaging  A/P:   1. Right eye vision loss: Ophtho found no abnormalities and felt that his subjective loss of vision did not match his exam findings. On my assessment, he clearly blinks to threat in the right eye indicating that he has vision. He reports no light perception yet his pupil reacts normally with no APD. These findings would suggest the possibility of a somatoform disorder underlying his vision loss. Recommend  supportive care with PT/OT as needed.   2. R occipital neuralgia: He has a mild R occipital neuralgia on exam with palpation of the nerve reproducing his headache. Will start scheduled ibuprofen 800 mg TID for three days then change to PRN.   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 Lab workup is underway. His vitamin B12 level is in the low normal range. Neuropathy has been observed with levels in this range. Agree with oral supplementation. Will give a single dose of 1000 mcg parenterally today. Await remaining labs.   4. R hemisensory loss: He continues to have decreased sensation on the R side limited to the face, arm, and leg. This is subjective. No evidence of cerebral pathology is seen on the MRI that would explain this. Follow exam.   5. Dysphagia: He reportedly failed his bedside swallow at the OSH. He has nothing that  should affect his swallowing and per patient and notes he was able to eat crackers and drink without difficulty after the failed swallow at the OSH. Per RN, she is unable to repeat a bedside swallow per policy and is awaiting SLP for formal evaluation. Hopefully this will be done soon so that he can eat.   This was discussed with the patient. Education was provided on the diagnosis and expected evaluation and treatment. He is in agreement with the plan as noted. He was given the opportunity to ask any questions and these were addressed to his satisfaction.   Rhona Leavens, MD [facility]

## 2016-10-22 NOTE — Care Management Note (Signed)
Case Management Note  Patient Details  Name: Jeff Schultz MRN: [id number] Date of Birth: 1965/07/09  Subjective/Objective:     Pt in with retinal artery occlusion. He is from home with his spouse.  He does not have insurance or PCP.              Action/Plan: No f/u per PT. Awaiting OT recs. CM provided him information on low cost clinics in the [place] area: [facility] and the [facility] [place]. Pt to follow up with one of these clinics post d/c. CM following.  Expected Discharge Date:                  Expected Discharge Plan:  Home/Self Care  In-House Referral:     Discharge planning Services  CM Consult  Post Acute Care Choice:    Choice offered to:     DME Arranged:    DME Agency:     HH Arranged:    HH Agency:     Status of Service:  In process, will continue to follow  If discussed at Long Length of Stay Meetings, dates discussed:    Additional Comments:  Kermit Balo, RN 10/22/2016, 2:43 PM
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
## 2016-10-22 NOTE — Evaluation (Signed)
Occupational Therapy Evaluation Patient Details Name: Jeff Schultz MRN: [id number] DOB: [birth year] Today's Date: 10/22/2016    History of Present Illness Pt is a 51 y/o male admitted secondary to acute loss of vision in R eye. All imaging has been negative. Per neurology's most recent note, physical exam findings are not corresponding to pt's subject reports. Neurology attributing pt's presentation to possible somatoform disorder. PMH including but not limited to tobacco abuse.   Clinical Impression   PTA Pt independent in ADL/IADL and mobility. Working full time as a Naval architect. Pt at modified independent during ADLs in session. Please see section on vision below. Pt with no further acute OT needs, OT to sign off. Progress rehab after follow up with Ophthalmology. Thank you for this referral.     Follow Up Recommendations  No OT follow up    Equipment Recommendations  None recommended by OT    Recommendations for Other Services       Precautions / Restrictions Precautions Precautions: None Precaution Comments: R eye visual discrepency  Restrictions Weight Bearing Restrictions: No      Mobility Bed Mobility Overal bed mobility: Modified Independent                Transfers Overall transfer level: Modified independent Equipment used: None                  Balance Overall balance assessment: Needs assistance Sitting-balance support: Feet supported Sitting balance-Leahy Scale: Normal     Standing balance support: During functional activity;No upper extremity supported Standing balance-Leahy Scale: Fair                             ADL either performed or assessed with clinical judgement   ADL Overall ADL's : Modified independent                                       General ADL Comments: Pt able to dress himself, perform tub transfer, sink level grooming     Vision Baseline Vision/History: Wears glasses Wears  Glasses: Reading only Patient Visual Report: Other (comment) (R eye sees "Black") Vision Assessment?: Yes Eye Alignment: Within Functional Limits Ocular Range of Motion: Within Functional Limits Alignment/Gaze Preference: Within Defined Limits Tracking/Visual Pursuits: Able to track stimulus in all quads without difficulty Depth Perception:  [facility]) Additional Comments: Pt states that he cannot see anything out of right eye, he can read from menu, find objects around the room, and when covering his left eye tracked therapist (and reacted by blinking to potential harm to right eye but reports not seeing any of it.)     Perception     Praxis      Pertinent Vitals/Pain Pain Assessment: No/denies pain     Hand Dominance Right   Extremity/Trunk Assessment Upper Extremity Assessment Upper Extremity Assessment: RUE deficits/detail RUE Deficits / Details: Strength is [facility] throughout with pt reporting a "crawling" sensation with light touch input throughout R UE. Pt is able to perform finger dexterity exercises, flip through a book, and tie a bow   Lower Extremity Assessment Lower Extremity Assessment: Defer to PT evaluation   Cervical / Trunk Assessment Cervical / Trunk Assessment: Normal   Communication Communication Communication: No difficulties   Cognition Arousal/Alertness: Awake/alert Behavior During Therapy: WFL for tasks assessed/performed Overall Cognitive Status: Within Functional Limits for tasks assessed  General Comments: pt alert and oriented, appropriately following commands throughout evaluation   General Comments  Pt verbalized on several occations that he cannot drive and will not drive for safety    Exercises     Shoulder Instructions      Home Living Family/patient expects to be discharged to:: Private residence Living Arrangements: Spouse/significant other;Children Available Help at Discharge: Family;Available  24 hours/day Type of Home: House Home Access: Stairs to enter [employer] of Steps: 1 Entrance Stairs-Rails: None Home Layout: One level     Bathroom Shower/Tub: Chief Strategy Officer: Standard     Home Equipment: None          Prior Functioning/Environment Level of Independence: Independent        Comments: 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 Problem List: Impaired vision/perception      OT Treatment/Interventions:      OT Goals(Current goals can be found in the care plan section) Acute Rehab OT Goals Patient Stated Goal: return home OT Goal Formulation: With patient Time For Goal Achievement: 10/29/16 Potential to Achieve Goals: Good  OT Frequency:     Barriers to D/C:            Co-evaluation              AM-PAC PT "6 Clicks" Daily Activity     Outcome Measure Help from another person eating meals?: None Help from another person taking care of personal grooming?: None Help from another person toileting, which includes using toliet, bedpan, or urinal?: None Help from another person bathing (including washing, rinsing, drying)?: None Help from another person to put on and taking off regular upper body clothing?: None Help from another person to put on and taking off regular lower body clothing?: None 6 Click Score: 24   End of Session Nurse Communication: Mobility status  Activity Tolerance: Patient tolerated treatment well Patient left: in chair;with call bell/phone within reach  OT Visit Diagnosis: Other symptoms and signs involving the nervous system (R29.898)                Time: 1610-9604 OT Time Calculation (min): 17 min Charges:  OT General Charges $OT Visit: 1 Procedure OT Evaluation $OT Eval Moderate Complexity: 1 Procedure G-Codes:     Sherryl Manges OTR/L [phone]   Evern Bio Henley Blyth 10/22/2016, 5:58 PM
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
## 2016-10-22 NOTE — Progress Notes (Addendum)
Discharge instructions reviewed with patient/family. All questions answered at this time. RX given. Transport home by family. Per MD, pt does not need Echo at this time.   Sim Boast, RN

## 2016-10-23 LAB — HEMOGLOBIN A1C
Hgb A1c MFr Bld: 5.4 % (ref 4.8–5.6)
MEAN PLASMA GLUCOSE: 108 mg/dL

## 2017-04-16 ENCOUNTER — Encounter (HOSPITAL_COMMUNITY): Payer: Self-pay | Admitting: Emergency Medicine

## 2017-04-16 ENCOUNTER — Emergency Department (HOSPITAL_COMMUNITY): Payer: 59

## 2017-04-16 ENCOUNTER — Emergency Department (HOSPITAL_COMMUNITY)
Admission: EM | Admit: 2017-04-16 | Discharge: 2017-04-16 | Disposition: A | Discharge status: Home / Self Care | Payer: 59 | Attending: Emergency Medicine | Admitting: Emergency Medicine

## 2017-04-16 DIAGNOSIS — R1110 Vomiting, unspecified: Secondary | ICD-10-CM

## 2017-04-16 DIAGNOSIS — Y929 Unspecified place or not applicable: Secondary | ICD-10-CM | POA: Insufficient documentation

## 2017-04-16 DIAGNOSIS — T18128A Food in esophagus causing other injury, initial encounter: Secondary | ICD-10-CM | POA: Insufficient documentation

## 2017-04-16 DIAGNOSIS — R079 Chest pain, unspecified: Secondary | ICD-10-CM

## 2017-04-16 DIAGNOSIS — X58XXXA Exposure to other specified factors, initial encounter: Secondary | ICD-10-CM | POA: Insufficient documentation

## 2017-04-16 DIAGNOSIS — Y999 Unspecified external cause status: Secondary | ICD-10-CM | POA: Insufficient documentation

## 2017-04-16 DIAGNOSIS — Y9389 Activity, other specified: Secondary | ICD-10-CM | POA: Insufficient documentation

## 2017-04-16 DIAGNOSIS — R112 Nausea with vomiting, unspecified: Secondary | ICD-10-CM | POA: Insufficient documentation

## 2017-04-16 DIAGNOSIS — R0789 Other chest pain: Secondary | ICD-10-CM | POA: Insufficient documentation

## 2017-04-16 DIAGNOSIS — Z79899 Other long term (current) drug therapy: Secondary | ICD-10-CM | POA: Insufficient documentation

## 2017-04-16 HISTORY — DX: Hyperlipidemia, unspecified: E78.5

## 2017-04-16 LAB — BASIC METABOLIC PANEL
ANION GAP: 8 (ref 5–15)
BUN: 20 mg/dL (ref 6–20)
CALCIUM: 9.2 mg/dL (ref 8.9–10.3)
CO2: 28 mmol/L (ref 22–32)
Chloride: 101 mmol/L (ref 101–111)
Creatinine, Ser: 1.06 mg/dL (ref 0.61–1.24)
GFR calc Af Amer: >60 mL/min (ref >60)
GFR calc non Af Amer: >60 mL/min (ref >60)
GLUCOSE: 83 mg/dL (ref 65–99)
Potassium: 3.7 mmol/L (ref 3.5–5.1)
Sodium: 137 mmol/L (ref 135–145)

## 2017-04-16 LAB — CBC
HEMATOCRIT: 44.2 % (ref 39.0–52.0)
HEMOGLOBIN: 14.7 g/dL (ref 13.0–17.0)
MCH: 29.2 pg (ref 26.0–34.0)
MCHC: 33.3 g/dL (ref 30.0–36.0)
MCV: 87.9 fL (ref 78.0–100.0)
Platelets: 225 10*3/uL (ref 150–400)
RBC: 5.03 MIL/uL (ref 4.22–5.81)
RDW: 12.7 % (ref 11.5–15.5)
WBC: 9.8 10*3/uL (ref 4.0–10.5)

## 2017-04-16 LAB — TROPONIN I: Troponin I: <0.03 ng/mL (ref <0.03)

## 2017-04-16 MED ORDER — GI COCKTAIL ~~LOC~~
30.0000 mL | Freq: Once | ORAL | Status: AC
  Administered 2017-04-16: 30 mL via ORAL
  Filled 2017-04-16: qty 30

## 2017-04-16 MED ORDER — ONDANSETRON HCL 4 MG/2ML IJ SOLN
4.0000 mg | Freq: Once | INTRAMUSCULAR | Status: AC
  Administered 2017-04-16: 4 mg via INTRAVENOUS
  Filled 2017-04-16: qty 2

## 2017-04-16 MED ORDER — PANTOPRAZOLE SODIUM 40 MG IV SOLR
40.0000 mg | Freq: Once | INTRAVENOUS | Status: AC
  Administered 2017-04-16: 40 mg via INTRAVENOUS
  Filled 2017-04-16: qty 40

## 2017-04-16 MED ORDER — OMEPRAZOLE 20 MG PO CPDR: 20.0000 mg | DELAYED_RELEASE_CAPSULE | Freq: Two times a day (BID) | ORAL | 1 refills | Status: DC

## 2017-04-16 MED ORDER — MORPHINE SULFATE (PF) 4 MG/ML IV SOLN
4.0000 mg | INTRAVENOUS | Status: DC | PRN
  Administered 2017-04-16: 4 mg via INTRAVENOUS
  Filled 2017-04-16: qty 1

## 2017-04-16 NOTE — Discharge Instructions (Signed)
Call Dr. Karilyn Cotaehman, Gastroenterologist, for follow up appointment. You need to have an endoscopy to evaluate your esophagus for a tightening called a stricture.  Rx for acid-blocker, Prilosec.

## 2017-04-16 NOTE — ED Provider Notes (Signed)
[facility] Provider Note   CSN: [id number] Arrival date & time: 04/16/17  1328     History   Chief Complaint Chief Complaint  Patient presents with  . Chest Pain    HPI Jeff Schultz is a 51 y.o. male. CC: Vomiting, and chest pain.  HPI: 51 year old male. He was eating some pork. He felt like it stuck in his esophagus. He began retching and vomiting. He had several episodes of emesis. He vomited back up for pork and some other food he had eaten. Chest continued to hurt. He presents here. He has had past similar episodes of feeling as though his food "hung up" but never felt as though it stuck and didn't go down as it did today  Past Medical History:  Diagnosis Date  . Hemopneumothorax   . Hyperlipidemia   . Jaw fracture (HCC)   . Skull fracture [facility](HCC)     Patient Active Problem List   Diagnosis Date Noted  . B12 deficiency 10/22/2016  . 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 Occipital neuralgia of right side 10/22/2016  . Retinal artery occlusion 10/21/2016  . Multinodular goiter 10/21/2016  . Vision loss of right eye 10/21/2016  . Eye pain, right 10/21/2016  . Headache 10/21/2016  . Tobacco abuse 10/21/2016    Past Surgical History:  Procedure Laterality Date  . CHEST TUBE INSERTION    . collapsed lung         Home Medications    Prior to Admission medications   Medication Sig Start Date End Date Taking? Authorizing Provider  vitamin B-12 1000 MCG tablet Take 1 tablet (1,000 mcg total) by mouth daily. 10/23/16  Yes Dhungel, Nishant, MD  omeprazole (PRILOSEC) 20 MG capsule Take 1 capsule (20 mg total) by mouth 2 (two) times daily. 04/16/17   Rolland PorterJames, Merl Bommarito, MD    Family History Family History  Problem Relation Age of Onset  . Cancer Unknown   . Diabetes Unknown   . Heart attack Unknown   . Stroke Unknown     Social History Social History  Substance Use Topics  . Smoking status: Never Smoker  . Smokeless tobacco: Current User     Types: Snuff  . Alcohol use Yes     Comment: occasional     Allergies   Penicillins and Hydrocodone   Review of Systems Review of Systems  Constitutional: Negative for appetite change, chills, diaphoresis, fatigue and fever.  HENT: Negative for mouth sores, sore throat and trouble swallowing.   Eyes: Negative for visual disturbance.  Respiratory: Negative for cough, chest tightness, shortness of breath and wheezing.   Cardiovascular: Positive for chest pain.  Gastrointestinal: Positive for nausea and vomiting. Negative for abdominal distention, abdominal pain and diarrhea.  Endocrine: Negative for polydipsia, polyphagia and polyuria.  Genitourinary: Negative for dysuria, frequency and hematuria.  Musculoskeletal: Negative for gait problem.  Skin: Negative for color change, pallor and rash.  Neurological: Negative for dizziness, syncope, light-headedness and headaches.  Hematological: Does not bruise/bleed easily.  Psychiatric/Behavioral: Negative for behavioral problems and confusion.     Physical Exam Updated Vital Signs BP (!) 120/94   Pulse 63   Resp (!) 25   Ht 6\' 4"  (1.93 m)   Wt 104.3 kg (230 lb)   SpO2 98%   BMI 28.00 kg/m   Physical Exam  Constitutional: He is oriented to person, place, and time. He appears well-developed and well-nourished. No distress.  HENT:  Head: Normocephalic.  Eyes: Pupils are equal, round,  and reactive to light. Conjunctivae are normal. No scleral icterus.  Neck: Normal range of motion. Neck supple. No thyromegaly present.  Cardiovascular: Normal rate and regular rhythm.  Exam reveals no gallop and no friction rub.   No murmur heard. Pulmonary/Chest: Effort normal and breath sounds normal. No respiratory distress. He has no wheezes. He has no rales.  Abdominal: Soft. Bowel sounds are normal. He exhibits no distension. There is no tenderness. There is no rebound.  Musculoskeletal: Normal range of motion.  Neurological: He is alert  and oriented to person, place, and time.  Skin: Skin is warm and dry. No rash noted.  Psychiatric: He has a normal mood and affect. His behavior is normal.     ED Treatments / Results  Labs (all labs ordered are listed, but only abnormal results are displayed) Labs Reviewed  BASIC METABOLIC PANEL  CBC  TROPONIN I  TROPONIN I    EKG  EKG Interpretation  Date/Time:  Thursday April 16 2017 13:36:26 EDT Ventricular Rate:  66 PR Interval:    QRS Duration: 102 QT Interval:  401 QTC Calculation: 417 R Axis:   6 Text Interpretation:  Sinus rhythm RSR' in V1 or V2, Unchanged Confirmed by Rolland Porter ([id number]) on 04/16/2017 1:45:38 PM       Radiology Dg Chest 2 View  Result Date: 04/16/2017 CLINICAL DATA:  Chest pain today. History of hypertension and smoking. EXAM: CHEST  2 VIEW COMPARISON:  06/25/2016 FINDINGS: Heart size is normal. Mediastinal shadows are normal. The vascularity is normal. Lungs are clear. No effusions. No acute bone finding. IMPRESSION: No active cardiopulmonary disease. Electronically Signed   By: Paulina Fusi M.D.   On: 04/16/2017 14:07    Procedures Procedures (including critical care time)  Medications Ordered in ED Medications  morphine 4 MG/ML injection 4 mg (4 mg Intravenous Given 04/16/17 1403)  pantoprazole (PROTONIX) injection 40 mg (40 mg Intravenous Given 04/16/17 1403)  ondansetron (ZOFRAN) injection 4 mg (4 mg Intravenous Given 04/16/17 1403)  gi cocktail (Maalox,Lidocaine,Donnatal) (30 mLs Oral Given 04/16/17 1608)     Initial Impression / Assessment and Plan / ED Course  I have reviewed the triage vital signs and the nursing notes.  Pertinent labs & imaging results that were available during my care of the patient were reviewed by me and considered in my medical decision making (see chart for details).    Chest x-ray does not show mediastinal air, pneumothorax, or pleural effusion. No findings to suggest esophageal rupture. EKG  without acute or ischemic changes and no change versus comparison. Normal initial, and repeat 3 hour troponin. Patient given IV pain medication and his symptoms improved. Given cocktail and his esophageal/chest pain further improves.  Plan is DC with GI referral, and PPI Rx.  Final Clinical Impressions(s) / ED Diagnoses   Final diagnoses:  Chest pain, unspecified type  Vomiting, intractability of vomiting not specified, presence of nausea not specified, unspecified vomiting type  Food impaction of esophagus, initial encounter    New Prescriptions New Prescriptions   OMEPRAZOLE (PRILOSEC) 20 MG CAPSULE    Take 1 capsule (20 mg total) by mouth 2 (two) times daily.     Rolland Porter, MD 04/16/17 [phone]

## 2017-04-16 NOTE — ED Triage Notes (Signed)
Complains of chest pain that started this morning with pain that radiates to left arm, neck and back. Sates shortness of breath, and nausea. He has vomited 5 times today.

## 2017-04-29 ENCOUNTER — Other Ambulatory Visit (HOSPITAL_COMMUNITY): Payer: Self-pay | Admitting: Otolaryngology

## 2017-04-29 DIAGNOSIS — R1313 Dysphagia, pharyngeal phase: Secondary | ICD-10-CM

## 2017-05-07 ENCOUNTER — Ambulatory Visit (HOSPITAL_COMMUNITY)
Admission: RE | Admit: 2017-05-07 | Discharge: 2017-05-07 | Disposition: A | Discharge status: Home / Self Care | Payer: Self-pay | Source: Ambulatory Visit | Attending: Otolaryngology | Admitting: Otolaryngology

## 2017-05-07 DIAGNOSIS — K219 Gastro-esophageal reflux disease without esophagitis: Secondary | ICD-10-CM | POA: Insufficient documentation

## 2017-05-07 DIAGNOSIS — R1313 Dysphagia, pharyngeal phase: Secondary | ICD-10-CM

## 2017-05-07 DIAGNOSIS — R1310 Dysphagia, unspecified: Secondary | ICD-10-CM | POA: Insufficient documentation

## 2017-07-27 ENCOUNTER — Emergency Department (HOSPITAL_COMMUNITY): Payer: Self-pay

## 2017-07-27 ENCOUNTER — Encounter (HOSPITAL_COMMUNITY): Payer: Self-pay | Admitting: Emergency Medicine

## 2017-07-27 ENCOUNTER — Emergency Department (HOSPITAL_COMMUNITY)
Admission: EM | Admit: 2017-07-27 | Discharge: 2017-07-27 | Disposition: A | Discharge status: Home / Self Care | Payer: Self-pay | Attending: Emergency Medicine | Admitting: Emergency Medicine

## 2017-07-27 ENCOUNTER — Other Ambulatory Visit: Payer: Self-pay

## 2017-07-27 DIAGNOSIS — J111 Influenza due to unidentified influenza virus with other respiratory manifestations: Secondary | ICD-10-CM | POA: Insufficient documentation

## 2017-07-27 DIAGNOSIS — M7918 Myalgia, other site: Secondary | ICD-10-CM | POA: Insufficient documentation

## 2017-07-27 DIAGNOSIS — Z79899 Other long term (current) drug therapy: Secondary | ICD-10-CM | POA: Insufficient documentation

## 2017-07-27 DIAGNOSIS — R69 Illness, unspecified: Secondary | ICD-10-CM

## 2017-07-27 DIAGNOSIS — R05 Cough: Secondary | ICD-10-CM | POA: Insufficient documentation

## 2017-07-27 MED ORDER — PROMETHAZINE-DM 6.25-15 MG/5ML PO SYRP: 5.0000 mL | ORAL_SOLUTION | Freq: Four times a day (QID) | ORAL | 0 refills | Status: DC | PRN

## 2017-07-27 MED ORDER — IBUPROFEN 800 MG PO TABS: 800.0000 mg | ORAL_TABLET | Freq: Three times a day (TID) | ORAL | 0 refills | Status: DC

## 2017-07-27 MED ORDER — ALBUTEROL SULFATE (2.5 MG/3ML) 0.083% IN NEBU: 5.0000 mg | INHALATION_SOLUTION | Freq: Once | RESPIRATORY_TRACT | Status: DC

## 2017-07-27 MED ORDER — ALBUTEROL SULFATE HFA 108 (90 BASE) MCG/ACT IN AERS
2.0000 | INHALATION_SPRAY | RESPIRATORY_TRACT | Status: DC | PRN
  Administered 2017-07-27: 2 via RESPIRATORY_TRACT
  Filled 2017-07-27: qty 6.7

## 2017-07-27 NOTE — ED Provider Notes (Signed)
[facility]   CSN: [id number] Arrival date & time: 07/27/17  1550     History   Chief Complaint Chief Complaint  Patient presents with  . Shortness of Breath    HPI Ruben GottronRichard C Schultz is a 52 y.o. male.  Chief complaint is cough, fever, and body aches.  HPI: 52 year old male.  Has had symptoms for 4 days.  Fever, "terrible body aches", cough, feel short of breath and wheezing with exertion.  No specific chest pain.  Dry cough.  No sputum.  When asked about flu vaccine he states "no, I will never get a flu shot".  No history of heart or lung disease.  Past Medical History:  Diagnosis Date  . Hemopneumothorax   . Hyperlipidemia   . Jaw fracture (HCC)   . Skull fracture [facility](HCC)     Patient Active Problem List   Diagnosis Date Noted  . B12 deficiency 10/22/2016  . 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 Occipital neuralgia of right side 10/22/2016  . Retinal artery occlusion 10/21/2016  . Multinodular goiter 10/21/2016  . Vision loss of right eye 10/21/2016  . Eye pain, right 10/21/2016  . Headache 10/21/2016  . Tobacco abuse 10/21/2016    Past Surgical History:  Procedure Laterality Date  . CHEST TUBE INSERTION    . collapsed lung         Home Medications    Prior to Admission medications   Medication Sig Start Date End Date Taking? Authorizing Provider  ibuprofen (ADVIL,MOTRIN) 800 MG tablet Take 1 tablet (800 mg total) by mouth 3 (three) times daily. 07/27/17   Rolland PorterJames, Lennon Richins, MD  omeprazole (PRILOSEC) 20 MG capsule Take 1 capsule (20 mg total) by mouth 2 (two) times daily. 04/16/17   Rolland PorterJames, Ruthie Berch, MD  promethazine-dextromethorphan (PROMETHAZINE-DM) 6.25-15 MG/5ML syrup Take 5 mLs by mouth 4 (four) times daily as needed for cough. 07/27/17   Rolland PorterJames, Amaris Delafuente, MD  vitamin B-12 1000 MCG tablet Take 1 tablet (1,000 mcg total) by mouth daily. 10/23/16   Dhungel, Theda BelfastNishant, MD    Family History Family History  Problem Relation Age of Onset  .  Cancer Unknown   . Diabetes Unknown   . Heart attack Unknown   . Stroke Unknown     Social History Social History   Tobacco Use  . Smoking status: Never Smoker  . Smokeless tobacco: Current User    Types: Snuff  Substance Use Topics  . Alcohol use: Yes    Comment: occasional  . Drug use: No     Allergies   Penicillins and Hydrocodone   Review of Systems Review of Systems  Constitutional: Negative for appetite change, chills, diaphoresis, fatigue and fever.  HENT: Negative for mouth sores, sore throat and trouble swallowing.   Eyes: Negative for visual disturbance.  Respiratory: Positive for cough, shortness of breath and wheezing. Negative for chest tightness.   Cardiovascular: Negative for chest pain.  Gastrointestinal: Negative for abdominal distention, abdominal pain, diarrhea, nausea and vomiting.  Endocrine: Negative for polydipsia, polyphagia and polyuria.  Genitourinary: Negative for dysuria, frequency and hematuria.  Musculoskeletal: Negative for gait problem.  Skin: Negative for color change, pallor and rash.  Neurological: Negative for dizziness, syncope, light-headedness and headaches.  Hematological: Does not bruise/bleed easily.  Psychiatric/Behavioral: Negative for behavioral problems and confusion.     Physical Exam Updated Vital Signs BP (!) 149/89 (BP Location: Left Arm)   Pulse 90   Temp 98.2 F (36.8 C) (Oral)   Resp 18  Ht 6\' 5"  (1.956 m)   Wt 102.5 kg (226 lb)   SpO2 98%   BMI 26.80 kg/m   Physical Exam  Constitutional: He is oriented to person, place, and time. He appears well-developed and well-nourished. No distress.  HENT:  Head: Normocephalic.  Eyes: Conjunctivae are normal. Pupils are equal, round, and reactive to light. No scleral icterus.  Neck: Normal range of motion. Neck supple. No thyromegaly present.  Cardiovascular: Normal rate and regular rhythm. Exam reveals no gallop and no friction rub.  No murmur  heard. Pulmonary/Chest: Effort normal. No respiratory distress. He has no wheezes. He has no rales.  Mild wheezing with cough.  Abdominal: Soft. Bowel sounds are normal. He exhibits no distension. There is no tenderness. There is no rebound.  Musculoskeletal: Normal range of motion.  Neurological: He is alert and oriented to person, place, and time.  Skin: Skin is warm and dry. No rash noted.  Psychiatric: He has a normal mood and affect. His behavior is normal.     ED Treatments / Results  Labs (all labs ordered are listed, but only abnormal results are displayed) Labs Reviewed - No data to display  EKG  EKG Interpretation  Date/Time:  Monday July 27 2017 16:16:43 EST Ventricular Rate:  86 PR Interval:  142 QRS Duration: 96 QT Interval:  366 QTC Calculation: 437 R Axis:   -24 Text Interpretation:  Normal sinus rhythm RSR' or QR pattern in V1 suggests right ventricular conduction delay Left ventricular hypertrophy Abnormal ECG Confirmed by Rolland Porter ([id number]) on 07/27/2017 4:54:25 PM       Radiology Dg Chest 2 View  Result Date: 07/27/2017 CLINICAL DATA:  Productive cough, shortness of Breath EXAM: CHEST  2 VIEW COMPARISON:  04/16/2017 FINDINGS: Heart and mediastinal contours are within normal limits. No focal opacities or effusions. No acute bony abnormality. IMPRESSION: No active cardiopulmonary disease. Electronically Signed   By: Charlett Nose M.D.   On: 07/27/2017 17:00    Procedures Procedures (including critical care time)  Medications Ordered in ED Medications  albuterol (PROVENTIL) (2.5 MG/3ML) 0.083% nebulizer solution 5 mg (5 mg Nebulization Not Given 07/27/17 1722)  albuterol (PROVENTIL HFA;VENTOLIN HFA) 108 (90 Base) MCG/ACT inhaler 2 puff (not administered)     Initial Impression / Assessment and Plan / ED Course  I have reviewed the triage vital signs and the nursing notes.  Pertinent labs & imaging results that were available during my care of the  patient were reviewed by me and considered in my medical decision making (see chart for details).    Fever, headache, body aches, cough.  Normal x-ray.  Saturating well.  Likely influenza or influenza-like illness.  Given an albuterol MDI and instructed in its use.  Phenergan with DM for cough.  Ibuprofen.  Rest fluids.  Primary care follow-up.  ER with acute changes.  Final Clinical Impressions(s) / ED Diagnoses   Final diagnoses:  Influenza-like illness    ED Discharge Orders        Ordered    ibuprofen (ADVIL,MOTRIN) 800 MG tablet  3 times daily     07/27/17 1854    promethazine-dextromethorphan (PROMETHAZINE-DM) 6.25-15 MG/5ML syrup  4 times daily PRN     07/27/17 1854       Rolland Porter, MD 07/27/17 1909

## 2017-07-27 NOTE — Discharge Instructions (Signed)
Symptoms are consistent with seasonal flu. You may have symptoms for 3-4 more days. Use the inhaler for wheezing or coughing. Phenergan-DM syrup for cough.  Causes drowsiness.  Do not drive within 6 hours. Ibuprofen for fever, or body aches.

## 2017-07-27 NOTE — ED Triage Notes (Signed)
Pt c/o SOB since Friday. Also has productive cough, chills, and generalized body aches. Co-worker with similar sx dx with pneumonia.

## 2018-01-18 ENCOUNTER — Observation Stay (HOSPITAL_COMMUNITY): Payer: BLUE CROSS/BLUE SHIELD

## 2018-01-18 ENCOUNTER — Emergency Department (HOSPITAL_COMMUNITY): Payer: BLUE CROSS/BLUE SHIELD

## 2018-01-18 ENCOUNTER — Observation Stay (HOSPITAL_COMMUNITY)
Admission: EM | Admit: 2018-01-18 | Discharge: 2018-01-19 | Disposition: A | Discharge status: Home / Self Care | Payer: BLUE CROSS/BLUE SHIELD | Attending: Internal Medicine | Admitting: Internal Medicine

## 2018-01-18 ENCOUNTER — Other Ambulatory Visit: Payer: Self-pay

## 2018-01-18 ENCOUNTER — Encounter (HOSPITAL_COMMUNITY): Payer: Self-pay

## 2018-01-18 DIAGNOSIS — G43909 Migraine, unspecified, not intractable, without status migrainosus: Secondary | ICD-10-CM | POA: Insufficient documentation

## 2018-01-18 DIAGNOSIS — F17220 Nicotine dependence, chewing tobacco, uncomplicated: Secondary | ICD-10-CM | POA: Diagnosis not present

## 2018-01-18 DIAGNOSIS — Z79899 Other long term (current) drug therapy: Secondary | ICD-10-CM | POA: Insufficient documentation

## 2018-01-18 DIAGNOSIS — H34239 Retinal artery branch occlusion, unspecified eye: Secondary | ICD-10-CM | POA: Insufficient documentation

## 2018-01-18 DIAGNOSIS — R200 Anesthesia of skin: Principal | ICD-10-CM | POA: Diagnosis present

## 2018-01-18 DIAGNOSIS — M542 Cervicalgia: Secondary | ICD-10-CM | POA: Diagnosis present

## 2018-01-18 DIAGNOSIS — Z720 Tobacco use: Secondary | ICD-10-CM | POA: Diagnosis not present

## 2018-01-18 DIAGNOSIS — D519 Vitamin B12 deficiency anemia, unspecified: Secondary | ICD-10-CM | POA: Insufficient documentation

## 2018-01-18 DIAGNOSIS — E042 Nontoxic multinodular goiter: Secondary | ICD-10-CM | POA: Diagnosis present

## 2018-01-18 DIAGNOSIS — E538 Deficiency of other specified B group vitamins: Secondary | ICD-10-CM | POA: Diagnosis present

## 2018-01-18 DIAGNOSIS — H538 Other visual disturbances: Secondary | ICD-10-CM

## 2018-01-18 DIAGNOSIS — R531 Weakness: Secondary | ICD-10-CM | POA: Diagnosis not present

## 2018-01-18 DIAGNOSIS — H349 Unspecified retinal vascular occlusion: Secondary | ICD-10-CM | POA: Diagnosis present

## 2018-01-18 HISTORY — DX: Deficiency of other specified B group vitamins: E53.8

## 2018-01-18 LAB — VITAMIN B12: VITAMIN B 12: 669 pg/mL (ref 180–914)

## 2018-01-18 LAB — I-STAT CHEM 8, ED
BUN: 20 mg/dL (ref 6–20)
CHLORIDE: 104 mmol/L (ref 98–111)
CREATININE: 1.2 mg/dL (ref 0.61–1.24)
Calcium, Ion: 1.2 mmol/L (ref 1.15–1.40)
Glucose, Bld: 92 mg/dL (ref 70–99)
HEMATOCRIT: 43 % (ref 39.0–52.0)
Hemoglobin: 14.6 g/dL (ref 13.0–17.0)
Potassium: 3.7 mmol/L (ref 3.5–5.1)
SODIUM: 140 mmol/L (ref 135–145)
TCO2: 25 mmol/L (ref 22–32)

## 2018-01-18 LAB — URINALYSIS, ROUTINE W REFLEX MICROSCOPIC
Bacteria, UA: NONE SEEN
Bilirubin Urine: NEGATIVE
Glucose, UA: NEGATIVE mg/dL
Ketones, ur: NEGATIVE mg/dL
Leukocytes, UA: NEGATIVE
NITRITE: NEGATIVE
Protein, ur: NEGATIVE mg/dL
Specific Gravity, Urine: 1.035 — ABNORMAL HIGH (ref 1.005–1.030)
pH: 5 (ref 5.0–8.0)

## 2018-01-18 LAB — COMPREHENSIVE METABOLIC PANEL
ALBUMIN: 4 g/dL (ref 3.5–5.0)
ALK PHOS: 104 U/L (ref 38–126)
ALT: 27 U/L (ref 0–44)
ANION GAP: 7 (ref 5–15)
AST: 18 U/L (ref 15–41)
BILIRUBIN TOTAL: 0.7 mg/dL (ref 0.3–1.2)
BUN: 21 mg/dL — AB (ref 6–20)
CALCIUM: 9.2 mg/dL (ref 8.9–10.3)
CO2: 27 mmol/L (ref 22–32)
Chloride: 106 mmol/L (ref 98–111)
Creatinine, Ser: 1.16 mg/dL (ref 0.61–1.24)
GFR calc Af Amer: >60 mL/min (ref >60)
GFR calc non Af Amer: >60 mL/min (ref >60)
GLUCOSE: 94 mg/dL (ref 70–99)
Potassium: 3.7 mmol/L (ref 3.5–5.1)
SODIUM: 140 mmol/L (ref 135–145)
TOTAL PROTEIN: 7.2 g/dL (ref 6.5–8.1)

## 2018-01-18 LAB — I-STAT TROPONIN, ED: Troponin i, poc: 0 ng/mL (ref 0.00–0.08)

## 2018-01-18 LAB — PROTIME-INR
INR: 0.92
Prothrombin Time: 12.3 seconds (ref 11.4–15.2)

## 2018-01-18 LAB — CBC
HCT: 43.8 % (ref 39.0–52.0)
HEMOGLOBIN: 14.9 g/dL (ref 13.0–17.0)
MCH: 30.4 pg (ref 26.0–34.0)
MCHC: 34 g/dL (ref 30.0–36.0)
MCV: 89.4 fL (ref 78.0–100.0)
PLATELETS: 205 10*3/uL (ref 150–400)
RBC: 4.9 MIL/uL (ref 4.22–5.81)
RDW: 13 % (ref 11.5–15.5)
WBC: 9 10*3/uL (ref 4.0–10.5)

## 2018-01-18 LAB — DIFFERENTIAL
BASOS ABS: 0 10*3/uL (ref 0.0–0.1)
Basophils Relative: 0 %
EOS PCT: 12 %
Eosinophils Absolute: 1.1 10*3/uL — ABNORMAL HIGH (ref 0.0–0.7)
LYMPHS ABS: 1.7 10*3/uL (ref 0.7–4.0)
LYMPHS PCT: 19 %
Monocytes Absolute: 0.7 10*3/uL (ref 0.1–1.0)
Monocytes Relative: 8 %
NEUTROS ABS: 5.5 10*3/uL (ref 1.7–7.7)
NEUTROS PCT: 61 %

## 2018-01-18 LAB — RAPID URINE DRUG SCREEN, HOSP PERFORMED
AMPHETAMINES: NOT DETECTED
BARBITURATES: NOT DETECTED
Benzodiazepines: NOT DETECTED
Cocaine: NOT DETECTED
Opiates: NOT DETECTED
TETRAHYDROCANNABINOL: NOT DETECTED

## 2018-01-18 LAB — ETHANOL: Alcohol, Ethyl (B): <10 mg/dL (ref <10)

## 2018-01-18 LAB — APTT: APTT: 30 s (ref 24–36)

## 2018-01-18 MED ORDER — TOPIRAMATE 25 MG PO TABS: 25.0000 mg | ORAL_TABLET | Freq: Every day | ORAL | Status: DC

## 2018-01-18 MED ORDER — ACETAMINOPHEN 325 MG PO TABS
650.0000 mg | ORAL_TABLET | ORAL | Status: DC | PRN
  Administered 2018-01-19 (×2): 650 mg via ORAL
  Filled 2018-01-18 (×2): qty 2

## 2018-01-18 MED ORDER — ACETAMINOPHEN 650 MG RE SUPP: 650.0000 mg | RECTAL | Status: DC | PRN

## 2018-01-18 MED ORDER — TOPIRAMATE 25 MG PO TABS: 25.0000 mg | ORAL_TABLET | Freq: Two times a day (BID) | ORAL | Status: DC

## 2018-01-18 MED ORDER — STROKE: EARLY STAGES OF RECOVERY BOOK: Freq: Once | Status: DC

## 2018-01-18 MED ORDER — SENNOSIDES-DOCUSATE SODIUM 8.6-50 MG PO TABS: 1.0000 | ORAL_TABLET | Freq: Every evening | ORAL | Status: DC | PRN

## 2018-01-18 MED ORDER — ACETAMINOPHEN 160 MG/5ML PO SOLN: 650.0000 mg | ORAL | Status: DC | PRN

## 2018-01-18 MED ORDER — MORPHINE SULFATE (PF) 2 MG/ML IV SOLN
1.0000 mg | Freq: Once | INTRAVENOUS | Status: AC
  Administered 2018-01-18: 1 mg via INTRAVENOUS
  Filled 2018-01-18: qty 1

## 2018-01-18 MED ORDER — ENOXAPARIN SODIUM 40 MG/0.4ML ~~LOC~~ SOLN: 40.0000 mg | SUBCUTANEOUS | Status: DC

## 2018-01-18 MED ORDER — IOPAMIDOL (ISOVUE-370) INJECTION 76%
75.0000 mL | Freq: Once | INTRAVENOUS | Status: AC | PRN
  Administered 2018-01-18: 75 mL via INTRAVENOUS

## 2018-01-18 MED ORDER — SODIUM CHLORIDE 0.9 % IV SOLN
INTRAVENOUS | Status: DC
  Administered 2018-01-18 – 2018-01-19 (×2): via INTRAVENOUS

## 2018-01-18 MED ORDER — ASPIRIN 81 MG PO CHEW
324.0000 mg | CHEWABLE_TABLET | Freq: Once | ORAL | Status: AC
  Administered 2018-01-18: 324 mg via ORAL
  Filled 2018-01-18: qty 4

## 2018-01-18 MED ORDER — STROKE: EARLY STAGES OF RECOVERY BOOK
Freq: Once | Status: AC
  Administered 2018-01-18: 15:00:00
  Filled 2018-01-18: qty 1

## 2018-01-18 MED ORDER — ASPIRIN 300 MG RE SUPP: 300.0000 mg | Freq: Every day | RECTAL | Status: DC

## 2018-01-18 MED ORDER — TOPIRAMATE 25 MG PO TABS
25.0000 mg | ORAL_TABLET | Freq: Every day | ORAL | Status: DC
  Administered 2018-01-18 – 2018-01-19 (×2): 25 mg via ORAL
  Filled 2018-01-18 (×2): qty 1

## 2018-01-18 MED ORDER — ENOXAPARIN SODIUM 60 MG/0.6ML ~~LOC~~ SOLN
50.0000 mg | SUBCUTANEOUS | Status: DC
  Administered 2018-01-18: 50 mg via SUBCUTANEOUS
  Filled 2018-01-18: qty 0.6

## 2018-01-18 MED ORDER — ASPIRIN 325 MG PO TABS
325.0000 mg | ORAL_TABLET | Freq: Every day | ORAL | Status: DC
  Administered 2018-01-19: 325 mg via ORAL
  Filled 2018-01-18: qty 1

## 2018-01-18 NOTE — ED Notes (Signed)
Pt was informed that we need a urine sample. Pt states that he can not urinate at this time. 

## 2018-01-18 NOTE — Consult Note (Signed)
    Date:01/18/18 [place] Tele[facility] Consult [facility]-STAT  Impression: Suspected R thalamic stroke - likely small vessel disease  Recommendations:  asa 325mg  x 1 now admit for stroke evaluation recommend MRI brain w/o contrast fasting lipid panel hba1c, tsh tte eval lvfx pt/ot/st dvt prophylaxis consider neurology consult as needed as per IM   ---------------------------------------------------------------------  QM:VHQICC:left sided numbness  History of Present Illness:  52yo M w no significant past medical history last normal at 21:00 last night before going to sleep who woke up with blurry vision, right neck pain and left face and arm and leg numbness.   Diagnostic Testing: hct NAF CTA H and N no LVO Vital Signs:    Exam:  Mental Status:  Awake, alert, oriented  Naming: Intact Repetition: Intact   Speech: fluent  Cranial Nerves:  No dysarthria Extraocular movements: Intact in all cardinal gaze Ptosis: Absent Visual fields: L VFD (upper and lower) Facial sensation: decreased left face Facial movements: Intact and symmetric    Motor Exam:  No drift   Tremor/Abnormal Movements:  Resting tremor: Absent Intention tremor: Absent Postural tremor: Absent  Sensory Exam:   Light touch: decreased L arm and leg    Coordination:   Finger to nose: Intact  nihss 3  Medical Decision Making:  - Extensive number of diagnosis or management options are considered above.   - Extensive amount of complex data reviewed.   - High risk of complication and/or morbidity or mortality are associated with differential diagnostic considerations above.  - There may be uncertain outcome and increased probability of prolonged functional impairment or high probability of severe prolonged functional impairment associated with some of these differential diagnosis.   Medical Data Reviewed:  1.Data reviewed include clinical labs, radiology,  Medical Tests;   2.Tests  results discussed w/performing or interpreting physician;   3.Obtaining/reviewing old medical records;  4.Obtaining case history from another source;  5.Independent review of image, tracing or specimen.    Patient was informed the Neurology Consult would happen via TeleHealth consult by way of interactive audio and video telecommunications and consented to receiving care in this manner.

## 2018-01-18 NOTE — ED Provider Notes (Signed)
[facility] Provider Note   CSN: [id number] Arrival date & time: 01/18/18  0715     History   Chief Complaint Chief Complaint  Patient presents with  . Neck Pain  . Blurred Vision    HPI Jeff Schultz is a 52 y.o. male with a history of HLD, tobacco use, sensory polyneuropathy, retinal artery occlusion, occipital neuralgia of right side, headache, who presents with neck pain.  Patient reports that 1.5 weeks ago he began to have RUQ abdominal pain and went to the ED in [place] (1 week prior to presentation). He states that he was diagnosed with "an obstruction" and prescribed Miralax. During his stay, he was given an unknown pain medication and began feeling "out of it." He also began to have chest pain and SOB. He states that the nurse then gave him "something to reverse the medication." His symptoms then resolved. He reports that since this happened, he has been having severe right-sided neck pain. He says that the pain has been constant throughout the week. He has tried taking BC powders q4h for the last 3 days without alleviation of symptoms.   He states he woke up this morning with bilateral blurring vision. He states that he stood up and became very "uneasy." He began driving to work and became concerned when blurry vision did not improve. He then decided to come to the ED.  He states that his last known normal was last night at 9:00 PM. He endorses left-sided numbness. He denies any recent chest pain and SOB. Denies headache. Denies abdominal pain, changes in bowel movements, urinary symptoms.   Past Medical History:  Diagnosis Date  . B12 deficiency   . Hemopneumothorax   . Hyperlipidemia   . Jaw fracture (HCC)   . Skull fracture [facility](HCC)     Patient Active Problem List   Diagnosis Date Noted  . B12 deficiency 10/22/2016  . 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 Occipital neuralgia of right side 10/22/2016  . Retinal artery occlusion 10/21/2016  .  Multinodular goiter 10/21/2016  . Vision loss of right eye 10/21/2016  . Eye pain, right 10/21/2016  . Headache 10/21/2016  . Tobacco abuse 10/21/2016    Past Surgical History:  Procedure Laterality Date  . CHEST TUBE INSERTION    . collapsed lung          Home Medications    Prior to Admission medications   Medication Sig Start Date End Date Taking? Authorizing Provider  ibuprofen (ADVIL,MOTRIN) 800 MG tablet Take 1 tablet (800 mg total) by mouth 3 (three) times daily. 07/27/17   Rolland PorterJames, Mark, MD  omeprazole (PRILOSEC) 20 MG capsule Take 1 capsule (20 mg total) by mouth 2 (two) times daily. 04/16/17   Rolland PorterJames, Mark, MD  promethazine-dextromethorphan (PROMETHAZINE-DM) 6.25-15 MG/5ML syrup Take 5 mLs by mouth 4 (four) times daily as needed for cough. 07/27/17   Rolland PorterJames, Mark, MD  vitamin B-12 1000 MCG tablet Take 1 tablet (1,000 mcg total) by mouth daily. 10/23/16   Dhungel, Theda BelfastNishant, MD    Family History Family History  Problem Relation Age of Onset  . Cancer Unknown   . Diabetes Unknown   . Heart attack Unknown   . Stroke Unknown     Social History Social History   Tobacco Use  . Smoking status: Never Smoker  . Smokeless tobacco: Current User    Types: Snuff  Substance Use Topics  . Alcohol use: Yes    Comment: occasional  . Drug use:  No     Allergies   Penicillins and Hydrocodone   Review of Systems Review of Systems  Constitutional: Negative for activity change, appetite change, chills, diaphoresis and fever.  HENT: Negative for congestion and sore throat.   Eyes: Positive for visual disturbance.  Respiratory: Negative for cough, chest tightness and shortness of breath.   Cardiovascular: Negative for chest pain, palpitations and leg swelling.  Gastrointestinal: Negative for abdominal distention, abdominal pain, constipation, diarrhea, nausea and vomiting.  Genitourinary: Negative for dysuria, frequency and hematuria.  Musculoskeletal: Positive for neck pain  (Right-sided).  Skin: Negative for rash.  Neurological: Positive for weakness, light-headedness and numbness. Negative for dizziness, facial asymmetry, speech difficulty and headaches.  All other systems reviewed and are negative.    Physical Exam Updated Vital Signs BP (!) 143/95 (BP Location: Left Arm)   Pulse 61   Temp 97.6 F (36.4 C) (Oral)   Resp 16   Ht 6\' 4"  (1.93 m)   Wt 102.2 kg (225 lb 6.4 oz)   SpO2 99%   BMI 27.44 kg/m   Physical Exam  Constitutional: He is oriented to person, place, and time. He appears well-developed and well-nourished.  HENT:  Head: Normocephalic and atraumatic.  Eyes: EOM are normal.  Cardiovascular: Normal rate, regular rhythm, normal heart sounds and intact distal pulses.  No carotid bruits appreciated  Pulmonary/Chest: Effort normal and breath sounds normal.  Abdominal: Soft. Bowel sounds are normal. He exhibits no distension. There is no tenderness.  Musculoskeletal: Normal range of motion.  Neurological: He is alert and oriented to person, place, and time. He has normal strength. He displays normal reflexes. A sensory deficit (Left-sided numbness (face, arm, leg)) is present. No cranial nerve deficit. Coordination (Positive Romberg, Abnormal finger-to-nose bilaterally) abnormal.     ED Treatments / Results  Labs (all labs ordered are listed, but only abnormal results are displayed) Labs Reviewed  DIFFERENTIAL - Abnormal; Notable for the following components:      Result Value   Eosinophils Absolute 1.1 (*)    All other components within normal limits  COMPREHENSIVE METABOLIC PANEL - Abnormal; Notable for the following components:   BUN 21 (*)    All other components within normal limits  URINALYSIS, ROUTINE W REFLEX MICROSCOPIC - Abnormal; Notable for the following components:   Specific Gravity, Urine 1.035 (*)    Hgb urine dipstick SMALL (*)    All other components within normal limits  ETHANOL  PROTIME-INR  APTT  CBC    RAPID URINE DRUG SCREEN, HOSP PERFORMED  VITAMIN B12  HIV ANTIBODY (ROUTINE TESTING)  HEMOGLOBIN A1C  LIPID PANEL  I-STAT CHEM 8, ED  I-STAT TROPONIN, ED    EKG EKG Interpretation  Date/Time:  Monday January 18 2018 07:29:27 EDT Ventricular Rate:  65 PR Interval:    QRS Duration: 107 QT Interval:  421 QTC Calculation: 438 R Axis:   20 Text Interpretation:  Sinus rhythm RSR' in V1 or V2, right VCD or RVH Baseline wander in lead(s) V2 V3 V6 Confirmed by Blane Ohara [phone]) on 01/18/2018 8:30:30 AM   Radiology Ct Angio Head W Or Wo Contrast  Result Date: 01/18/2018 CLINICAL DATA:  Right neck pain for 4 days. Blurred vision and dizziness today with numbness in the left arm. EXAM: CT ANGIOGRAPHY HEAD AND NECK TECHNIQUE: Multidetector CT imaging of the head and neck was performed using the standard protocol during bolus administration of intravenous contrast. Multiplanar CT image reconstructions and MIPs were obtained to evaluate the vascular  anatomy. Carotid stenosis measurements (when applicable) are obtained utilizing NASCET criteria, using the distal internal carotid diameter as the denominator. CONTRAST:  75mL ISOVUE-370 IOPAMIDOL (ISOVUE-370) INJECTION 76% COMPARISON:  10/21/2016 head and neck CTA and brain MRI FINDINGS: CT HEAD FINDINGS Brain: There is no evidence of acute infarct, intracranial hemorrhage, mass, midline shift, or extra-axial fluid collection. The ventricles and sulci are normal. Vascular: Evaluated below. Skull: No fracture focal osseous lesion. Sinuses: Visualized paranasal sinuses and mastoid air cells are clear. Orbits: Unremarkable. Review of the MIP images confirms the above findings CTA NECK FINDINGS Aortic arch: Standard 3 vessel aortic arch. Widely patent arch vessel origins. Right carotid system: Patent without evidence of stenosis or dissection. Mild atheromatous wall thickening of the distal common and proximal internal carotid arteries. Left carotid system:  Patent without evidence of stenosis or dissection. Mild atheromatous wall thickening of the distal common and proximal internal carotid arteries. Vertebral arteries: Patent and codominant without evidence of stenosis or dissection. Skeleton: No suspicious osseous lesion.  Mild cervical spondylosis. Other neck: Multiple small low-density thyroid nodules bilaterally as previously seen. Upper chest: Clear lung apices. Review of the MIP images confirms the above findings CTA HEAD FINDINGS Anterior circulation: The internal carotid arteries are widely patent from skull base to carotid termini. ACAs and MCAs are patent without evidence of proximal branch occlusion or significant proximal stenosis. No aneurysm is identified. Posterior circulation: The intracranial vertebral arteries are widely patent to the basilar. Patent PICA and SCA origins are visualized bilaterally. The basilar artery is widely patent. Posterior communicating arteries are not identified. The PCAs are patent without evidence of significant proximal stenosis. No aneurysm is identified. Venous sinuses: Patent. Anatomic variants: None. Delayed phase: No abnormal enhancement. Review of the MIP images confirms the above findings IMPRESSION: 1. Patent intracranial and extracranial arterial vasculature without evidence of major branch occlusion, significant stenosis, or aneurysm. 2. Unremarkable CT appearance of the brain. Electronically Signed   By: Sebastian Ache M.D.   On: 01/18/2018 09:22   Ct Angio Neck W And/or Wo Contrast  Result Date: 01/18/2018 CLINICAL DATA:  Right neck pain for 4 days. Blurred vision and dizziness today with numbness in the left arm. EXAM: CT ANGIOGRAPHY HEAD AND NECK TECHNIQUE: Multidetector CT imaging of the head and neck was performed using the standard protocol during bolus administration of intravenous contrast. Multiplanar CT image reconstructions and MIPs were obtained to evaluate the vascular anatomy. Carotid stenosis  measurements (when applicable) are obtained utilizing NASCET criteria, using the distal internal carotid diameter as the denominator. CONTRAST:  75mL ISOVUE-370 IOPAMIDOL (ISOVUE-370) INJECTION 76% COMPARISON:  10/21/2016 head and neck CTA and brain MRI FINDINGS: CT HEAD FINDINGS Brain: There is no evidence of acute infarct, intracranial hemorrhage, mass, midline shift, or extra-axial fluid collection. The ventricles and sulci are normal. Vascular: Evaluated below. Skull: No fracture focal osseous lesion. Sinuses: Visualized paranasal sinuses and mastoid air cells are clear. Orbits: Unremarkable. Review of the MIP images confirms the above findings CTA NECK FINDINGS Aortic arch: Standard 3 vessel aortic arch. Widely patent arch vessel origins. Right carotid system: Patent without evidence of stenosis or dissection. Mild atheromatous wall thickening of the distal common and proximal internal carotid arteries. Left carotid system: Patent without evidence of stenosis or dissection. Mild atheromatous wall thickening of the distal common and proximal internal carotid arteries. Vertebral arteries: Patent and codominant without evidence of stenosis or dissection. Skeleton: No suspicious osseous lesion.  Mild cervical spondylosis. Other neck: Multiple small low-density thyroid nodules  bilaterally as previously seen. Upper chest: Clear lung apices. Review of the MIP images confirms the above findings CTA HEAD FINDINGS Anterior circulation: The internal carotid arteries are widely patent from skull base to carotid termini. ACAs and MCAs are patent without evidence of proximal branch occlusion or significant proximal stenosis. No aneurysm is identified. Posterior circulation: The intracranial vertebral arteries are widely patent to the basilar. Patent PICA and SCA origins are visualized bilaterally. The basilar artery is widely patent. Posterior communicating arteries are not identified. The PCAs are patent without evidence of  significant proximal stenosis. No aneurysm is identified. Venous sinuses: Patent. Anatomic variants: None. Delayed phase: No abnormal enhancement. Review of the MIP images confirms the above findings IMPRESSION: 1. Patent intracranial and extracranial arterial vasculature without evidence of major branch occlusion, significant stenosis, or aneurysm. 2. Unremarkable CT appearance of the brain. Electronically Signed   By: Sebastian Ache M.D.   On: 01/18/2018 09:22    Procedures Procedures (including critical care time)  Medications Ordered in ED Medications - No data to display   Initial Impression / Assessment and Plan / ED Course  I have reviewed the triage vital signs and the nursing notes.  Pertinent labs & imaging results that were available during my care of the patient were reviewed by me and considered in my medical decision making (see chart for details).  EDAHI KROENING is a 52 y.o. male with a history of HLD, tobacco use, sensory polyneuropathy, retinal artery occlusion, occipital neuralgia of right side, headache, who presents with neck pain and left-sided numbness. Patient's symptoms in the setting of multiple cardiovascular risk factors is likely secondary to CVA (embolic vs thrombotic). Carotic artery dissection is less likely given unremarkable CT angio head and neck. Patient is currently outside of the tPA treatment window (>4.5 hours). CMP, CBC, urinalysis unremarkable. Troponin negative. EKG unremarkable. PT/INR and PTT within normal limits. Urine tox screen negative. Patient will be admitted to medicine for further stroke work-up.    Final Clinical Impressions(s) / ED Diagnoses   Final diagnoses:  Neck pain  Blurry vision, bilateral  Weakness    ED Discharge Orders    None       Synetta Shadow, MD 01/18/18 Ebony Cargo    Blane Ohara, MD 01/22/18 1029

## 2018-01-18 NOTE — Consult Note (Signed)
Jeff A. Merlene Laughter, MD     [email or web address]          Jeff Schultz is an 52 y.o. male.   ASSESSMENT/PLAN: 1.   Recurrent neurological symptoms of unclear etiology: This makes a second event with extensive work-up being essentially negative.  Given the prominent pain component, migraine syndromes may be an issue.  Topamax will be initiated.  EEG will be obtained.    The patient is a 52 year old white male who presents with the acute onset of bilateral blurring of his vision associated with numbness on the left side.  The patient presented with similar symptoms a year ago.  He had total loss of vision at that time on the right.  Work-up is summarized below and essentially was unrevealing.  He did see ophthalmology and they thought that the vision loss was non-anatomical.  The patient does report having significant pain involving the right occipital region and in neck region on the right side.  This was similar to presentation a year ago.  He continues to have symptoms today.  The review of systems otherwise negative.   GENERAL: This is a pleasant male who is in no acute distress.  HEENT: Mild pain involving the right neck region in the right occipital region.  Neck is supple.  No trauma appreciated.  ABDOMEN: soft  EXTREMITIES: No edema   BACK: Normal  SKIN: Normal by inspection.    MENTAL STATUS: Alert and oriented. Speech, language and cognition are generally intact. Judgment and insight normal.   CRANIAL NERVES: Pupils are equal, round and reactive to light and accomodation; extra ocular movements are full, there is no significant nystagmus; visual fields are full; upper and lower facial muscles are normal in strength and symmetric, there is no flattening of the nasolabial folds; tongue is midline; uvula is midline; shoulder elevation is normal.  MOTOR: Normal tone, bulk and strength; no pronator drift.  COORDINATION: Left finger to nose is normal,  right finger to nose is normal, No rest tremor; no intention tremor; no postural tremor; no bradykinesia.  REFLEXES: Deep tendon reflexes are symmetrical and normal. Plantar reflexes are flexor bilaterally.   SENSATION: Reduced sensation to temperature and light touch on the left side.           NEURO CONSULT 10-2016 occlusion with AION. Ophthalmology consultation is noted and on their exam no evidence of structural pathology was identified that would explain his complaints. He felt that his symptoms are largely subjective with no ophthalmologic explanation and recommended outpatient follow-up, no further workup. Given absence of additional neurologic findings as well as absence of acute ischemia on MRI brain, I find no neurologic explanation for his presentation. This raises concern for possible somatization disorder. Follow exam.  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 Check hemoglobin A1c to evaluate for possible diabetes. I will also check vitamin B12, folate, and RPR. TSH has been ordered and is pending. This does not appear to be pain. No pharmacologic intervention is necessary. Follow exam.  NEURO FU NOTE 10-2016 1. Right eye vision loss: Ophtho found no abnormalities and felt that his subjective loss of vision did not match his exam findings. On my assessment, he clearly blinks to threat in the right eye indicating that he has vision. He reports no light perception yet his pupil reacts normally with no APD. These findings would suggest the possibility of a somatoform disorder underlying his  vision loss. Recommend supportive care with PT/OT as needed.   2. R occipital neuralgia: He has a mild R occipital neuralgia on exam with palpation of the nerve reproducing his headache. Will start scheduled ibuprofen 800 mg TID for three days then change to PRN.   3. 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 Lab workup is underway. His vitamin B12 level is in the low normal range. Neuropathy has been observed with levels in this range. Agree with oral supplementation. Will give a single dose of 1000 mcg parenterally today. Await remaining labs.   4. R hemisensory loss: He continues to have decreased sensation on the R side limited to the face, arm, and leg. This is subjective. No evidence of cerebral pathology is seen on the MRI that would explain this. Follow exam.   5. Dysphagia: He reportedly failed his bedside swallow at the OSH. He has nothing that should affect his swallowing and per patient and notes he was able to eat crackers and drink without difficulty after the failed swallow at the OSH. Per RN, she is unable to repeat a bedside swallow per policy and is awaiting SLP for formal evaluation. Hopefully this will be done soon so that he can eat.       Blood pressure 139/84, pulse 67, temperature 97.6 F (36.4 C), temperature source Oral, resp. rate 20, height 6' 4" (1.93 m), weight 223 lb 15.8 oz (101.6 kg), SpO2 98 %.  Past Medical History:  Diagnosis Date  . B12 deficiency   . Hemopneumothorax   . Hyperlipidemia   . Jaw fracture ([place])   . Skull fracture [facility])     Past Surgical History:  Procedure Laterality Date  . CHEST TUBE INSERTION    . collapsed lung      Family History  Problem Relation Age of Onset  . Cancer Unknown   . Diabetes Unknown   . Heart attack Unknown   . Stroke Unknown     Social History:  reports that he has never smoked. His smokeless tobacco use includes snuff. He reports that he drinks alcohol. He reports that he does not use drugs.  Allergies:  Allergies  Allergen Reactions  . Penicillins Anaphylaxis    Has patient had a PCN reaction causing immediate rash, facial/tongue/throat swelling, SOB or lightheadedness with hypotension: Yes Has patient had a PCN reaction causing severe rash involving  mucus membranes or skin necrosis: Yes Has patient had a PCN reaction that required hospitalization No Has patient had a PCN reaction occurring within the last 10 years: No If all of the above answers are "NO", then may proceed with Cephalosporin use.   Marland Kitchen Hydrocodone Itching    Medications: Prior to Admission medications   Medication Sig Start Date End Date Taking? Authorizing Provider  Aspirin-Salicylamide-Caffeine (ARTHRITIS STRENGTH BC POWDER PO) Take 1 Package by mouth 2 (two) times daily as needed.   Yes [provider]  vitamin B-12 1000 MCG tablet Take 1 tablet (1,000 mcg total) by mouth daily. 10/23/16  Yes Dhungel, Nishant, MD    Scheduled Meds: . aspirin  300 mg Rectal Daily   Or  . aspirin  325 mg Oral Daily  . enoxaparin (LOVENOX) injection  50 mg Subcutaneous Q24H   Continuous Infusions: . sodium chloride 75 mL/hr at 01/18/18 1842   PRN Meds:.acetaminophen **OR** acetaminophen (TYLENOL) oral liquid 160 mg/5 mL **OR** acetaminophen, senna-docusate     Results for orders placed or performed during the  hospital encounter of 01/18/18 (from the past 48 hour(s))  Ethanol     Status: None   Collection Time: 01/18/18  8:33 AM  Result Value Ref Range   Alcohol, Ethyl (B) <10 <10 mg/dL    Comment: (NOTE) Lowest detectable limit for serum alcohol is 10 mg/dL. For medical purposes only. Performed at [facility], [address], [place], [place] [address]   Protime-INR     Status: None   Collection Time: 01/18/18  8:33 AM  Result Value Ref Range   Prothrombin Time 12.3 11.4 - 15.2 seconds   INR 0.92     Comment: Performed at [facility], [address], [place], [place] [address]  APTT     Status: None   Collection Time: 01/18/18  8:33 AM  Result Value Ref Range   aPTT 30 24 - 36 seconds    Comment: Performed at [facility], [address] [place], [place] [address]  CBC     Status: None   Collection Time: 01/18/18  8:33 AM  Result Value Ref Range   WBC 9.0  4.0 - 10.5 K/uL   RBC 4.90 4.22 - 5.81 MIL/uL   Hemoglobin 14.9 13.0 - 17.0 g/dL   HCT 43.8 39.0 - 52.0 %   MCV 89.4 78.0 - 100.0 fL   MCH 30.4 26.0 - 34.0 pg   MCHC 34.0 30.0 - 36.0 g/dL   RDW 13.0 11.5 - 15.5 %   Platelets 205 150 - 400 K/uL    Comment: Performed at [facility], [address], [place], [place] [address]  Differential     Status: Abnormal   Collection Time: 01/18/18  8:33 AM  Result Value Ref Range   Neutrophils Relative % 61 %   Neutro Abs 5.5 1.7 - 7.7 K/uL   Lymphocytes Relative 19 %   Lymphs Abs 1.7 0.7 - 4.0 K/uL   Monocytes Relative 8 %   Monocytes Absolute 0.7 0.1 - 1.0 K/uL   Eosinophils Relative 12 %   Eosinophils Absolute 1.1 (H) 0.0 - 0.7 K/uL   Basophils Relative 0 %   Basophils Absolute 0.0 0.0 - 0.1 K/uL    Comment: Performed at [facility], [address], [place], [place] [address]  Comprehensive metabolic panel     Status: Abnormal   Collection Time: 01/18/18  8:33 AM  Result Value Ref Range   Sodium 140 135 - 145 mmol/L   Potassium 3.7 3.5 - 5.1 mmol/L   Chloride 106 98 - 111 mmol/L   CO2 27 22 - 32 mmol/L   Glucose, Bld 94 70 - 99 mg/dL   BUN 21 (H) 6 - 20 mg/dL   Creatinine, Ser 1.16 0.61 - 1.24 mg/dL   Calcium 9.2 8.9 - 10.3 mg/dL   Total Protein 7.2 6.5 - 8.1 g/dL   Albumin 4.0 3.5 - 5.0 g/dL   AST 18 15 - 41 U/L   ALT 27 0 - 44 U/L   Alkaline Phosphatase 104 38 - 126 U/L   Total Bilirubin 0.7 0.3 - 1.2 mg/dL   GFR calc non Af Amer >60 >60 mL/min   GFR calc Af Amer >60 >60 mL/min    Comment: (NOTE) The eGFR has been calculated using the CKD EPI equation. This calculation has not been validated in all clinical situations. eGFR's persistently <60 mL/min signify possible Chronic Kidney Disease.    Anion gap 7 5 - 15    Comment: Performed at [facility], [address] [place],  [place] [address]  Urine rapid drug screen (hosp performed)     Status: None   Collection Time: 01/18/18  8:33 AM  Result Value Ref Range   Opiates  NONE DETECTED NONE DETECTED   Cocaine NONE DETECTED NONE DETECTED   Benzodiazepines NONE DETECTED NONE DETECTED   Amphetamines NONE DETECTED NONE DETECTED   Tetrahydrocannabinol NONE DETECTED NONE DETECTED   Barbiturates NONE DETECTED NONE DETECTED    Comment: (NOTE) DRUG SCREEN FOR MEDICAL PURPOSES ONLY.  IF CONFIRMATION IS NEEDED FOR ANY PURPOSE, NOTIFY LAB WITHIN 5 DAYS. LOWEST DETECTABLE LIMITS FOR URINE DRUG SCREEN Drug Class                     Cutoff (ng/mL) Amphetamine and metabolites    1000 Barbiturate and metabolites    200 Benzodiazepine                 315 Tricyclics and metabolites     300 Opiates and metabolites        300 Cocaine and metabolites        300 THC                            50 Performed at [facility], [address], [place], [place] [address]   Urinalysis, Routine w reflex microscopic     Status: Abnormal   Collection Time: 01/18/18  8:33 AM  Result Value Ref Range   Color, Urine YELLOW YELLOW   APPearance CLEAR CLEAR   Specific Gravity, Urine 1.035 (H) 1.005 - 1.030   pH 5.0 5.0 - 8.0   Glucose, UA NEGATIVE NEGATIVE mg/dL   Hgb urine dipstick SMALL (A) NEGATIVE   Bilirubin Urine NEGATIVE NEGATIVE   Ketones, ur NEGATIVE NEGATIVE mg/dL   Protein, ur NEGATIVE NEGATIVE mg/dL   Nitrite NEGATIVE NEGATIVE   Leukocytes, UA NEGATIVE NEGATIVE   RBC / HPF 0-5 0 - 5 RBC/hpf   WBC, UA 0-5 0 - 5 WBC/hpf   Bacteria, UA NONE SEEN NONE SEEN   Mucus PRESENT    Uric Acid Crys, UA PRESENT     Comment: Performed at [facility], [address], [place], [place] [address]  I-stat troponin, ED     Status: None   Collection Time: 01/18/18  8:37 AM  Result Value Ref Range   Troponin i, poc 0.00 0.00 - 0.08 ng/mL   Comment 3            Comment: Due to the release kinetics of cTnI, a negative result within the first hours of the onset of symptoms does not rule out myocardial infarction with certainty. If myocardial infarction is still suspected, repeat the  test at appropriate intervals.   I-Stat Chem 8, ED     Status: None   Collection Time: 01/18/18  8:39 AM  Result Value Ref Range   Sodium 140 135 - 145 mmol/L   Potassium 3.7 3.5 - 5.1 mmol/L   Chloride 104 98 - 111 mmol/L   BUN 20 6 - 20 mg/dL   Creatinine, Ser 1.20 0.61 - 1.24 mg/dL   Glucose, Bld 92 70 - 99 mg/dL   Calcium, Ion 1.20 1.15 - 1.40 mmol/L   TCO2 25 22 - 32 mmol/L   Hemoglobin 14.6 13.0 - 17.0 g/dL   HCT 43.0 39.0 - 52.0 %    Studies/Results:  BRAIN MRI  FINDINGS: BRAIN: There is no acute infarct, acute hemorrhage or mass effect.  The midline structures are normal. There are no old infarcts. The white matter signal is normal for the patient's age. The CSF spaces are normal for age, with no hydrocephalus. Susceptibility-sensitive sequences show no chronic microhemorrhage or superficial siderosis.  VASCULAR: Major intracranial arterial and venous sinus flow voids are preserved.  SKULL AND UPPER CERVICAL SPINE: The visualized skull base, calvarium, upper cervical spine and extracranial soft tissues are normal.  SINUSES/ORBITS: No fluid levels or advanced mucosal thickening. No mastoid or middle ear effusion. The orbits are normal.  IMPRESSION: Normal MRI of the brain.     HEAD NECK CTA CTA NECK FINDINGS  Aortic arch: Standard 3 vessel aortic arch. Widely patent arch vessel origins.  Right carotid system: Patent without evidence of stenosis or dissection. Mild atheromatous wall thickening of the distal common and proximal internal carotid arteries.  Left carotid system: Patent without evidence of stenosis or dissection. Mild atheromatous wall thickening of the distal common and proximal internal carotid arteries.  Vertebral arteries: Patent and codominant without evidence of stenosis or dissection.  Skeleton: No suspicious osseous lesion.  Mild cervical spondylosis.  Other neck: Multiple small low-density thyroid nodules  bilaterally as previously seen.  Upper chest: Clear lung apices.  Review of the MIP images confirms the above findings  CTA HEAD FINDINGS  Anterior circulation: The internal carotid arteries are widely patent from skull base to carotid termini. ACAs and MCAs are patent without evidence of proximal branch occlusion or significant proximal stenosis. No aneurysm is identified.  Posterior circulation: The intracranial vertebral arteries are widely patent to the basilar. Patent PICA and SCA origins are visualized bilaterally. The basilar artery is widely patent. Posterior communicating arteries are not identified. The PCAs are patent without evidence of significant proximal stenosis. No aneurysm is identified.  Venous sinuses: Patent.  Anatomic variants: None.  Delayed phase: No abnormal enhancement.  Review of the MIP images confirms the above findings  IMPRESSION: 1. Patent intracranial and extracranial arterial vasculature without evidence of major branch occlusion, significant stenosis, or aneurysm. 2. Unremarkable CT appearance of the brain.    The brain MRI is reviewed in person.  No Lesions are seen on DWI.  SWI shows no hemorrhage.  There is a reduced signal seen in the [place] on the right side suggestive of a possible remote lacunar infarct.  No white matter lesions are seen.   Livan Hires A. Merlene Schultz, M.D.  Diplomate, Tax adviser of Psychiatry and Neurology ( Neurology). 01/18/2018, 7:42 PM

## 2018-01-18 NOTE — ED Triage Notes (Signed)
Pt reports he woke up Tuesday with right side of neck hurting  And this morning his vision is blurry right left than left. No known injury to neck. Reports he went to hosp Monday for abd pain

## 2018-01-18 NOTE — H&P (Signed)
History and Physical    Jeff GottronRichard C Scritchfield ZOX:[id number] DOB: 11/17/1965 DOA: 01/18/2018  Referring MD/NP/PA: Blane OharaJoshua Zavitz, EDP PCP: Patient, No Pcp Per  Patient coming from: Home  Chief Complaint: Left-sided numbness, blurry vision  HPI: Jeff GottronRichard C Schultz is a 52 y.o. male with history of B12 deficiency comes into the hospital today for the above complaints.  He states 10 days [facility] with abdominal pain was given ketamine and has had blurry vision ever since.  Last night at around 9 PM noticed left-sided numbness, decided to come in today when symptoms persisted.  In the ED vital signs are stable, he is not orthostatic.Labs are unremarkable.  CT angiogram of the head and neck shows patent intra-and extracranial arterial vasculature without evidence of major branch occlusion or significant stenosis.  Unremarkable CT appearance of brain.  Seen by tele-neurology who recommends admission for Stroke work up.  Past Medical/Surgical History: Past Medical History:  Diagnosis Date  . B12 deficiency   . Hemopneumothorax   . Hyperlipidemia   . Jaw fracture (HCC)   . Skull fracture [facility](HCC)     Past Surgical History:  Procedure Laterality Date  . CHEST TUBE INSERTION    . collapsed lung      Social History:  reports that he has never smoked. His smokeless tobacco use includes snuff. He reports that he drinks alcohol. He reports that he does not use drugs.  Allergies: Allergies  Allergen Reactions  . Penicillins Anaphylaxis    Has patient had a PCN reaction causing immediate rash, facial/tongue/throat swelling, SOB or lightheadedness with hypotension: Yes Has patient had a PCN reaction causing severe rash involving mucus membranes or skin necrosis: Yes Has patient had a PCN reaction that required hospitalization No Has patient had a PCN reaction occurring within the last 10 years: No If all of the above answers are "NO", then may proceed with  Cephalosporin use.   Marland Kitchen. Hydrocodone Itching    Family History:  Family History  Problem Relation Age of Onset  . Cancer Unknown   . Diabetes Unknown   . Heart attack Unknown   . Stroke Unknown     Prior to Admission medications   Medication Sig Start Date End Date Taking? Authorizing Provider  Aspirin-Salicylamide-Caffeine (ARTHRITIS STRENGTH BC POWDER PO) Take 1 Package by mouth 2 (two) times daily as needed.   Yes [provider]  vitamin B-12 1000 MCG tablet Take 1 tablet (1,000 mcg total) by mouth daily. 10/23/16  Yes Dhungel, Nishant, MD    Review of Systems: Constitutional: Denies fever, chills, diaphoresis, appetite change and fatigue.  HEENT: Denies photophobia, eye pain, redness, hearing loss, ear pain, congestion, sore throat, rhinorrhea, sneezing, mouth sores, trouble swallowing, neck pain, neck stiffness and tinnitus.  C/o right-sided neck pain. Respiratory: Denies SOB, DOE, cough, chest tightness,  and wheezing.   Cardiovascular: Denies chest pain, palpitations and leg swelling.  Gastrointestinal: Denies nausea, vomiting, abdominal pain, diarrhea, constipation, blood in stool and abdominal distention.  Genitourinary: Denies dysuria, urgency, frequency, hematuria, flank pain and difficulty urinating.  Endocrine: Denies: hot or cold intolerance, sweats, changes in hair or nails, polyuria, polydipsia. Musculoskeletal: Denies myalgias, back pain, joint swelling, arthralgias and gait problem.  Skin: Denies pallor, rash and wound.  Neurological: Denies dizziness, seizures, syncope,  light-headedness, numbness and headaches.  Hematological: Denies adenopathy. Easy bruising, personal or family bleeding history  Psychiatric/Behavioral: Denies suicidal ideation, mood changes, confusion, nervousness, sleep disturbance and agitation  Physical Exam: Vitals:   01/18/18 1100 01/18/18 1127 01/18/18 1332 01/18/18 1530  BP: (!) 114/99 121/84 131/87 (!) 143/89  Pulse:  60  (!) 55 (!) 57  Resp: 11 20 18 20   Temp:  97.8 F (36.6 C) 97.6 F (36.4 C) 97.6 F (36.4 C)  TempSrc:  Oral Oral Oral  SpO2:  99% 99% 98%  Weight:  101.6 kg (223 lb 15.8 oz)    Height:  6\' 4"  (1.93 m)       Constitutional: NAD, calm, comfortable Eyes: PERRL, lids and conjunctivae normal ENMT: Mucous membranes are moist. Posterior pharynx clear of any exudate or lesions.Normal dentition.  Neck: normal, supple, no masses, no thyromegaly Respiratory: clear to auscultation bilaterally, no wheezing, no crackles. Normal respiratory effort. No accessory muscle use.  Cardiovascular: Regular rate and rhythm, no murmurs / rubs / gallops. No extremity edema. 2+ pedal pulses. No carotid bruits.  Abdomen: no tenderness, no masses palpated. No hepatosplenomegaly. Bowel sounds positive.  Musculoskeletal: no clubbing / cyanosis. No joint deformity upper and lower extremities. Good ROM, no contractures. Normal muscle tone.  Skin: no rashes, lesions, ulcers. No induration Neurologic: CN 2-12 grossly intact other than left facial numbness, grip strength decreased on the left arm, otherwise neurologic exam is intact Psychiatric: Normal judgment and insight. Alert and oriented x 3. Normal mood.    Labs on Admission: I have personally reviewed the following labs and imaging studies  CBC: Recent Labs  Lab 01/18/18 0833 01/18/18 0839  WBC 9.0  --   NEUTROABS 5.5  --   HGB 14.9 14.6  HCT 43.8 43.0  MCV 89.4  --   PLT 205  --    Basic Metabolic Panel: Recent Labs  Lab 01/18/18 0833 01/18/18 0839  NA 140 140  K 3.7 3.7  CL 106 104  CO2 27  --   GLUCOSE 94 92  BUN 21* 20  CREATININE 1.16 1.20  CALCIUM 9.2  --    GFR: Estimated Creatinine Clearance: 90.4 mL/min (by C-G formula based on SCr of 1.2 mg/dL). Liver Function Tests: Recent Labs  Lab 01/18/18 0833  AST 18  ALT 27  ALKPHOS 104  BILITOT 0.7  PROT 7.2  ALBUMIN 4.0   No results for input(s): LIPASE, AMYLASE in the last 168  hours. No results for input(s): AMMONIA in the last 168 hours. Coagulation Profile: Recent Labs  Lab 01/18/18 0833  INR 0.92   Cardiac Enzymes: No results for input(s): CKTOTAL, CKMB, CKMBINDEX, TROPONINI in the last 168 hours. BNP (last 3 results) No results for input(s): PROBNP in the last 8760 hours. HbA1C: No results for input(s): HGBA1C in the last 72 hours. CBG: No results for input(s): GLUCAP in the last 168 hours. Lipid Profile: No results for input(s): CHOL, HDL, LDLCALC, TRIG, CHOLHDL, LDLDIRECT in the last 72 hours. Thyroid Function Tests: No results for input(s): TSH, T4TOTAL, FREET4, T3FREE, THYROIDAB in the last 72 hours. Anemia Panel: No results for input(s): VITAMINB12, FOLATE, FERRITIN, TIBC, IRON, RETICCTPCT in the last 72 hours. Urine analysis:    Component Value Date/Time   COLORURINE YELLOW 01/18/2018 0833   APPEARANCEUR CLEAR 01/18/2018 0833   LABSPEC 1.035 (H) 01/18/2018 0833   PHURINE 5.0 01/18/2018 0833   GLUCOSEU NEGATIVE 01/18/2018 0833   HGBUR SMALL (A) 01/18/2018 0833   BILIRUBINUR NEGATIVE 01/18/2018 0833   KETONESUR NEGATIVE 01/18/2018 0833   PROTEINUR NEGATIVE 01/18/2018 0833   UROBILINOGEN 0.2 06/25/2011 1723   NITRITE NEGATIVE 01/18/2018 0833   LEUKOCYTESUR NEGATIVE  01/18/2018 0833   Sepsis Labs: @LABRCNTIP (procalcitonin:4,lacticidven:4) )No results found for this or any previous visit (from the past 240 hour(s)).   Radiological Exams on Admission: Ct Angio Head W Or Wo Contrast  Result Date: 01/18/2018 CLINICAL DATA:  Right neck pain for 4 days. Blurred vision and dizziness today with numbness in the left arm. EXAM: CT ANGIOGRAPHY HEAD AND NECK TECHNIQUE: Multidetector CT imaging of the head and neck was performed using the standard protocol during bolus administration of intravenous contrast. Multiplanar CT image reconstructions and MIPs were obtained to evaluate the vascular anatomy. Carotid stenosis measurements (when applicable) are  obtained utilizing NASCET criteria, using the distal internal carotid diameter as the denominator. CONTRAST:  75mL ISOVUE-370 IOPAMIDOL (ISOVUE-370) INJECTION 76% COMPARISON:  10/21/2016 head and neck CTA and brain MRI FINDINGS: CT HEAD FINDINGS Brain: There is no evidence of acute infarct, intracranial hemorrhage, mass, midline shift, or extra-axial fluid collection. The ventricles and sulci are normal. Vascular: Evaluated below. Skull: No fracture focal osseous lesion. Sinuses: Visualized paranasal sinuses and mastoid air cells are clear. Orbits: Unremarkable. Review of the MIP images confirms the above findings CTA NECK FINDINGS Aortic arch: Standard 3 vessel aortic arch. Widely patent arch vessel origins. Right carotid system: Patent without evidence of stenosis or dissection. Mild atheromatous wall thickening of the distal common and proximal internal carotid arteries. Left carotid system: Patent without evidence of stenosis or dissection. Mild atheromatous wall thickening of the distal common and proximal internal carotid arteries. Vertebral arteries: Patent and codominant without evidence of stenosis or dissection. Skeleton: No suspicious osseous lesion.  Mild cervical spondylosis. Other neck: Multiple small low-density thyroid nodules bilaterally as previously seen. Upper chest: Clear lung apices. Review of the MIP images confirms the above findings CTA HEAD FINDINGS Anterior circulation: The internal carotid arteries are widely patent from skull base to carotid termini. ACAs and MCAs are patent without evidence of proximal branch occlusion or significant proximal stenosis. No aneurysm is identified. Posterior circulation: The intracranial vertebral arteries are widely patent to the basilar. Patent PICA and SCA origins are visualized bilaterally. The basilar artery is widely patent. Posterior communicating arteries are not identified. The PCAs are patent without evidence of significant proximal stenosis. No  aneurysm is identified. Venous sinuses: Patent. Anatomic variants: None. Delayed phase: No abnormal enhancement. Review of the MIP images confirms the above findings IMPRESSION: 1. Patent intracranial and extracranial arterial vasculature without evidence of major branch occlusion, significant stenosis, or aneurysm. 2. Unremarkable CT appearance of the brain. Electronically Signed   By: Sebastian Ache M.D.   On: 01/18/2018 09:22   Ct Angio Neck W And/or Wo Contrast  Result Date: 01/18/2018 CLINICAL DATA:  Right neck pain for 4 days. Blurred vision and dizziness today with numbness in the left arm. EXAM: CT ANGIOGRAPHY HEAD AND NECK TECHNIQUE: Multidetector CT imaging of the head and neck was performed using the standard protocol during bolus administration of intravenous contrast. Multiplanar CT image reconstructions and MIPs were obtained to evaluate the vascular anatomy. Carotid stenosis measurements (when applicable) are obtained utilizing NASCET criteria, using the distal internal carotid diameter as the denominator. CONTRAST:  75mL ISOVUE-370 IOPAMIDOL (ISOVUE-370) INJECTION 76% COMPARISON:  10/21/2016 head and neck CTA and brain MRI FINDINGS: CT HEAD FINDINGS Brain: There is no evidence of acute infarct, intracranial hemorrhage, mass, midline shift, or extra-axial fluid collection. The ventricles and sulci are normal. Vascular: Evaluated below. Skull: No fracture focal osseous lesion. Sinuses: Visualized paranasal sinuses and mastoid air cells are clear. Orbits: Unremarkable.  Review of the MIP images confirms the above findings CTA NECK FINDINGS Aortic arch: Standard 3 vessel aortic arch. Widely patent arch vessel origins. Right carotid system: Patent without evidence of stenosis or dissection. Mild atheromatous wall thickening of the distal common and proximal internal carotid arteries. Left carotid system: Patent without evidence of stenosis or dissection. Mild atheromatous wall thickening of the distal  common and proximal internal carotid arteries. Vertebral arteries: Patent and codominant without evidence of stenosis or dissection. Skeleton: No suspicious osseous lesion.  Mild cervical spondylosis. Other neck: Multiple small low-density thyroid nodules bilaterally as previously seen. Upper chest: Clear lung apices. Review of the MIP images confirms the above findings CTA HEAD FINDINGS Anterior circulation: The internal carotid arteries are widely patent from skull base to carotid termini. ACAs and MCAs are patent without evidence of proximal branch occlusion or significant proximal stenosis. No aneurysm is identified. Posterior circulation: The intracranial vertebral arteries are widely patent to the basilar. Patent PICA and SCA origins are visualized bilaterally. The basilar artery is widely patent. Posterior communicating arteries are not identified. The PCAs are patent without evidence of significant proximal stenosis. No aneurysm is identified. Venous sinuses: Patent. Anatomic variants: None. Delayed phase: No abnormal enhancement. Review of the MIP images confirms the above findings IMPRESSION: 1. Patent intracranial and extracranial arterial vasculature without evidence of major branch occlusion, significant stenosis, or aneurysm. 2. Unremarkable CT appearance of the brain. Electronically Signed   By: Sebastian Ache M.D.   On: 01/18/2018 09:22    EKG: Independently reviewed.  Normal sinus rhythm with a right bundle branch block, no acute ischemic changes  Assessment/Plan Principal Problem:   Left arm numbness Active Problems:   Retinal artery occlusion   Multinodular goiter   Tobacco abuse   B12 deficiency    Left arm numbness/blurry vision -Of course most important concern would be CVA. -We will check MRI and complete stroke work-up to include 2D echo, PT evaluation, lipids. -Patient already had a CTA of the neck so carotid Dopplers are not necessary. -I note that he also has a history of  B12 deficiency.  This could certainly be playing a role in his neurological changes.  Check B12 level.  We will also check TSH given his history of multinodular goiter. -We will request neurology evaluation.  Tobacco abuse -I have discussed tobacco cessation with the patient.  I have counseled the patient regarding the negative impacts of continued tobacco use including but not limited to lung cancer, COPD, and cardiovascular disease.  I have discussed alternatives to tobacco and modalities that may help facilitate tobacco cessation including but not limited to biofeedback, hypnosis, and medications.  Total time spent with tobacco counseling was 4 minutes     DVT prophylaxis: Lovenox Code Status: Full code Family Communication: Patient only Disposition Plan: Likely home in 24 to 48 hours Consults called: Neurology Admission status: It is my clinical opinion that referral for OBSERVATION is reasonable and necessary in this patient based on the above information provided. The aforementioned taken together are felt to place the patient at high risk for further clinical deterioration. However it is anticipated that the patient may be medically stable for discharge from the hospital within 24 to 48 hours.      Time Spent: 85 minutes, this time is independent from time spent for smoking cessation counseling.  Chaya Jan MD [facility] Pager [phone]  If 7PM-7AM, please contact night-coverage [email or web address] Password [facility]  01/18/2018, 4:41 PM

## 2018-01-19 ENCOUNTER — Observation Stay (HOSPITAL_COMMUNITY)
Admit: 2018-01-19 | Discharge: 2018-01-19 | Disposition: A | Discharge status: Home / Self Care | Payer: BLUE CROSS/BLUE SHIELD | Attending: Neurology | Admitting: Neurology

## 2018-01-19 ENCOUNTER — Observation Stay (HOSPITAL_BASED_OUTPATIENT_CLINIC_OR_DEPARTMENT_OTHER): Payer: BLUE CROSS/BLUE SHIELD

## 2018-01-19 DIAGNOSIS — E538 Deficiency of other specified B group vitamins: Secondary | ICD-10-CM

## 2018-01-19 DIAGNOSIS — I6789 Other cerebrovascular disease: Secondary | ICD-10-CM | POA: Diagnosis not present

## 2018-01-19 DIAGNOSIS — R200 Anesthesia of skin: Secondary | ICD-10-CM | POA: Diagnosis not present

## 2018-01-19 LAB — HIV ANTIBODY (ROUTINE TESTING W REFLEX): HIV Screen 4th Generation wRfx: NONREACTIVE

## 2018-01-19 LAB — LIPID PANEL
CHOLESTEROL: 169 mg/dL (ref 0–200)
HDL: 24 mg/dL — AB (ref >40)
LDL Cholesterol: 104 mg/dL — ABNORMAL HIGH (ref 0–99)
Total CHOL/HDL Ratio: 7 RATIO
Triglycerides: 206 mg/dL — ABNORMAL HIGH (ref <150)
VLDL: 41 mg/dL — ABNORMAL HIGH (ref 0–40)

## 2018-01-19 LAB — ECHOCARDIOGRAM COMPLETE
Height: 76 in
Weight: 3583.8 oz

## 2018-01-19 LAB — HEMOGLOBIN A1C
HEMOGLOBIN A1C: 5.6 % (ref 4.8–5.6)
MEAN PLASMA GLUCOSE: 114.02 mg/dL

## 2018-01-19 MED ORDER — ASPIRIN EC 81 MG PO TBEC: 81.0000 mg | DELAYED_RELEASE_TABLET | Freq: Every day | ORAL | Status: AC

## 2018-01-19 MED ORDER — TOPIRAMATE 25 MG PO TABS: 25.0000 mg | ORAL_TABLET | Freq: Every day | ORAL | 2 refills | Status: DC

## 2018-01-19 NOTE — Plan of Care (Signed)
No acute distress overnight; will continue to monitor. 

## 2018-01-19 NOTE — Progress Notes (Signed)
EEG complete - results pending 

## 2018-01-19 NOTE — Procedures (Signed)
  [facility] Kehinde Totzke A. Gerilyn Pilgrimoonquah, MD     [email or web address]           HISTORY: This is a 52 year old man who presents with a recurrent focal neurological symptoms. The study is being done to evaluate for seizures as the cause.  MEDICATIONS: Scheduled Meds: Continuous Infusions: PRN Meds:.  Prior to Admission medications   Medication Sig Start Date End Date Taking? Authorizing Provider  aspirin EC 81 MG tablet Take 1 tablet (81 mg total) by mouth daily. 01/19/18 02/18/18  Philip AspenHernandez Acosta, Limmie PatriciaEstela Y, MD  topiramate (TOPAMAX) 25 MG tablet Take 1 tablet (25 mg total) by mouth daily. 01/20/18   Philip AspenHernandez Acosta, Limmie PatriciaEstela Y, MD  vitamin B-12 1000 MCG tablet Take 1 tablet (1,000 mcg total) by mouth daily. 10/23/16   Dhungel, Theda BelfastNishant, MD      ANALYSIS: A 16 channel recording using standard 10 20 measurements is conducted for 21 minutes. There is a dominant posterior rhythm of 9.5 - 10 Hz which attenuates with eye opening. There is beta activity seen over the frontal fields. Awake and drowsy activities are seen. Photic stimulation and hyperventilation were not conducted. There is no focal slowing, lateralized slowing or epileptiform activity.   IMPRESSION: 1. This is a normal recording of the awake and drowsy states.      Jolana Runkles A. Gerilyn Pilgrimoonquah, M.D.  Diplomate, Biomedical engineerAmerican Board of Psychiatry and Neurology ( Neurology).

## 2018-01-19 NOTE — Evaluation (Signed)
Physical Therapy Evaluation Patient Details Name: Jeff GottronRichard C Schultz MRN: [id number] DOB: 01/31/1966 Today's Date: 01/19/2018   History of Present Illness  Jeff GottronRichard C Schultz is a 52 y.o. male with history of B12 deficiency comes into the hospital today for the above complaints.  He states [facility] with abdominal pain was given ketamine and has had blurry vision ever since.  Last night at around 9 PM noticed left-sided numbness, decided to come in today when symptoms persisted.  In the ED vital signs are stable, he is not orthostatic.Labs are unremarkable.  CT angiogram of the head and neck shows patent intra-and extracranial arterial vasculature without evidence of major branch occlusion or significant stenosis.  Unremarkable CT appearance of brain.  Seen by tele-neurology who recommends admission for Stroke work up.    Clinical Impression  Patient functioning at baseline for functional mobility and gait.  Plan:  Patient discharged from physical therapy to care of nursing for ambulation daily as tolerated for length of stay.     Follow Up Recommendations No PT follow up    Equipment Recommendations  None recommended by PT    Recommendations for Other Services       Precautions / Restrictions Precautions Precautions: None Restrictions Weight Bearing Restrictions: No      Mobility  Bed Mobility Overal bed mobility: Independent                Transfers Overall transfer level: Independent                  Ambulation/Gait Ambulation/Gait assistance: Independent   Assistive device: None Gait Pattern/deviations: WFL(Within Functional Limits) Gait velocity: normal   General Gait Details: demonstrates good return for ambulating on level, inclined and declined surfaces without loss of balance  Stairs            Wheelchair Mobility    Modified Rankin (Stroke Patients Only)       Balance Overall balance  assessment: No apparent balance deficits (not formally assessed)                                           Pertinent Vitals/Pain Pain Assessment: 0-10 Pain Score: 9  Pain Location: chonic low back Pain Descriptors / Indicators: Aching;Discomfort Pain Intervention(s): Limited activity within patient's tolerance;Monitored during session    Home Living Family/patient expects to be discharged to:: Private residence Living Arrangements: Spouse/significant other Available Help at Discharge: Family Type of Home: House Home Access: Stairs to enter Entrance Stairs-Rails: None Secretary/administratorntrance Stairs-Number of Steps: 2 Home Layout: One level Home Equipment: Environmental consultantWalker - 2 wheels      Prior Function Level of Independence: Independent         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, works as a Public librariantruck driver     Hand Dominance   Dominant Hand: Right    Extremity/Trunk Assessment   Upper Extremity Assessment Upper Extremity Assessment: Overall WFL for tasks assessed    Lower Extremity Assessment Lower Extremity Assessment: Overall WFL for tasks assessed    Cervical / Trunk Assessment Cervical / Trunk Assessment: Normal  Communication   Communication: No difficulties  Cognition Arousal/Alertness: Awake/alert Behavior During Therapy: WFL for tasks assessed/performed Overall Cognitive Status: Within Functional Limits for tasks assessed  General Comments      Exercises     Assessment/Plan    PT Assessment Patent does not need any further PT services  PT Problem List         PT Treatment Interventions      PT Goals (Current goals can be found in the Care Plan section)  Acute Rehab PT Goals Patient Stated Goal: return home PT Goal Formulation: With patient Time For Goal Achievement: 01/19/18 Potential to Achieve Goals: Good    Frequency     Barriers to discharge        Co-evaluation                AM-PAC PT "6 Clicks" Daily Activity  Outcome Measure Difficulty turning over in bed (including adjusting bedclothes, sheets and blankets)?: None Difficulty moving from lying on back to sitting on the side of the bed? : None Difficulty sitting down on and standing up from a chair with arms (e.g., wheelchair, bedside commode, etc,.)?: None Help needed moving to and from a bed to chair (including a wheelchair)?: None Help needed walking in hospital room?: None Help needed climbing 3-5 steps with a railing? : None 6 Click Score: 24    End of Session   Activity Tolerance: Patient tolerated treatment well Patient left: in bed;with call bell/phone within reach Nurse Communication: Mobility status PT Visit Diagnosis: Unsteadiness on feet (R26.81);Muscle weakness (generalized) (M62.81);Other abnormalities of gait and mobility (R26.89)    Time: 4098-1191 PT Time Calculation (min) (ACUTE ONLY): 17 min   Charges:   PT Evaluation $PT Eval Low Complexity: 1 Low PT Treatments $Gait Training: 8-22 mins        12:10 PM, 01/19/18 Ocie Bob, MPT Physical Therapist with [facility] [phone] office [phone] mobile phone

## 2018-01-19 NOTE — Discharge Summary (Signed)
Physician Discharge Summary  Jeff Schultz:[id number] DOB: 10/25/1965 DOA: 01/18/2018  PCP: Patient, No Pcp Per  Admit date: 01/18/2018 Discharge date: 01/19/2018  Time spent: 45 minutes  Recommendations for Outpatient Follow-up:  -Will be discharged home today. -Advised follow up with Dr. Gerilyn Pilgrim in 4-6 weeks. -Started on topamax for migraine prophylaxis.  Discharge Diagnoses:  Principal Problem:   Left arm numbness Active Problems:   Retinal artery occlusion   Multinodular goiter   Tobacco abuse   B12 deficiency   Discharge Condition: Stable and improved  Filed Weights   01/18/18 0728 01/18/18 1127  Weight: 102.2 kg (225 lb 6.4 oz) 101.6 kg (223 lb 15.8 oz)    History of present illness:   Jeff Schultz is a 52 y.o. male with history of B12 deficiency comes into the hospital today for the above complaints.  He states 10 days ago he was at the [facility] emergency department with abdominal pain was given ketamine and has had blurry vision ever since.  Last night at around 9 PM noticed left-sided numbness, decided to come in today when symptoms persisted.  In the ED vital signs are stable, he is not orthostatic.Labs are unremarkable.  CT angiogram of the head and neck shows patent intra-and extracranial arterial vasculature without evidence of major branch occlusion or significant stenosis.  Unremarkable CT appearance of brain.  Seen by tele-neurology who recommends admission for Stroke work up.    Hospital Course:   Left sided numbness/Blurry vision -No evidence for acute CVA on MRI. -No vessel occlusion on CTA of head/neck. -ECHO: EF 60-65%, no WMA, normal LV diastolic function parameters. -Seen by neurology who believes a complicated migraine may be at play as he has some prominent pain features particularly of his neck and shoulders. -Because of this, has been started on topamax. -EEG has been obtained, and is normal.  Tobacco Abuse -I have  counseled on cessation this admission. -He remains in the pre-contemplative stage and does not appear ready to quit at this time.  Procedures:  ECHO/EEG as above  Consultations:  Neurology  Discharge Instructions  Discharge Instructions    Diet - low sodium heart healthy   Complete by:  As directed    Increase activity slowly   Complete by:  As directed      Allergies as of 01/19/2018      Reactions   Penicillins Anaphylaxis   Has patient had a PCN reaction causing immediate rash, facial/tongue/throat swelling, SOB or lightheadedness with hypotension: Yes Has patient had a PCN reaction causing severe rash involving mucus membranes or skin necrosis: Yes Has patient had a PCN reaction that required hospitalization No Has patient had a PCN reaction occurring within the last 10 years: No If all of the above answers are "NO", then may proceed with Cephalosporin use.   Hydrocodone Itching      Medication List    STOP taking these medications   ARTHRITIS STRENGTH BC POWDER PO     TAKE these medications   aspirin EC 81 MG tablet Take 1 tablet (81 mg total) by mouth daily.   cyanocobalamin 1000 MCG tablet Take 1 tablet (1,000 mcg total) by mouth daily.   topiramate 25 MG tablet Commonly known as:  TOPAMAX Take 1 tablet (25 mg total) by mouth daily. Start taking on:  01/20/2018      Allergies  Allergen Reactions  . Penicillins Anaphylaxis    Has patient had a PCN reaction causing immediate rash, facial/tongue/throat  swelling, SOB or lightheadedness with hypotension: Yes Has patient had a PCN reaction causing severe rash involving mucus membranes or skin necrosis: Yes Has patient had a PCN reaction that required hospitalization No Has patient had a PCN reaction occurring within the last 10 years: No If all of the above answers are "NO", then may proceed with Cephalosporin use.   Marland Kitchen Hydrocodone Itching   Follow-up Information    Beryle Beams, MD. Schedule an  appointment as soon as possible for a visit in 2 week(s).   Specialty:  Neurology Contact information: [address] [place] [address] [phone]            The results of significant diagnostics from this hospitalization (including imaging, microbiology, ancillary and laboratory) are listed below for reference.    Significant Diagnostic Studies: Ct Angio Head W Or Wo Contrast  Result Date: 01/18/2018 CLINICAL DATA:  Right neck pain for 4 days. Blurred vision and dizziness today with numbness in the left arm. EXAM: CT ANGIOGRAPHY HEAD AND NECK TECHNIQUE: Multidetector CT imaging of the head and neck was performed using the standard protocol during bolus administration of intravenous contrast. Multiplanar CT image reconstructions and MIPs were obtained to evaluate the vascular anatomy. Carotid stenosis measurements (when applicable) are obtained utilizing NASCET criteria, using the distal internal carotid diameter as the denominator. CONTRAST:  75mL ISOVUE-370 IOPAMIDOL (ISOVUE-370) INJECTION 76% COMPARISON:  10/21/2016 head and neck CTA and brain MRI FINDINGS: CT HEAD FINDINGS Brain: There is no evidence of acute infarct, intracranial hemorrhage, mass, midline shift, or extra-axial fluid collection. The ventricles and sulci are normal. Vascular: Evaluated below. Skull: No fracture focal osseous lesion. Sinuses: Visualized paranasal sinuses and mastoid air cells are clear. Orbits: Unremarkable. Review of the MIP images confirms the above findings CTA NECK FINDINGS Aortic arch: Standard 3 vessel aortic arch. Widely patent arch vessel origins. Right carotid system: Patent without evidence of stenosis or dissection. Mild atheromatous wall thickening of the distal common and proximal internal carotid arteries. Left carotid system: Patent without evidence of stenosis or dissection. Mild atheromatous wall thickening of the distal common and proximal internal carotid arteries. Vertebral  arteries: Patent and codominant without evidence of stenosis or dissection. Skeleton: No suspicious osseous lesion.  Mild cervical spondylosis. Other neck: Multiple small low-density thyroid nodules bilaterally as previously seen. Upper chest: Clear lung apices. Review of the MIP images confirms the above findings CTA HEAD FINDINGS Anterior circulation: The internal carotid arteries are widely patent from skull base to carotid termini. ACAs and MCAs are patent without evidence of proximal branch occlusion or significant proximal stenosis. No aneurysm is identified. Posterior circulation: The intracranial vertebral arteries are widely patent to the basilar. Patent PICA and SCA origins are visualized bilaterally. The basilar artery is widely patent. Posterior communicating arteries are not identified. The PCAs are patent without evidence of significant proximal stenosis. No aneurysm is identified. Venous sinuses: Patent. Anatomic variants: None. Delayed phase: No abnormal enhancement. Review of the MIP images confirms the above findings IMPRESSION: 1. Patent intracranial and extracranial arterial vasculature without evidence of major branch occlusion, significant stenosis, or aneurysm. 2. Unremarkable CT appearance of the brain. Electronically Signed   By: Sebastian Ache M.D.   On: 01/18/2018 09:22   Ct Angio Neck W And/or Wo Contrast  Result Date: 01/18/2018 CLINICAL DATA:  Right neck pain for 4 days. Blurred vision and dizziness today with numbness in the left arm. EXAM: CT ANGIOGRAPHY HEAD AND NECK TECHNIQUE: Multidetector CT imaging of the head  and neck was performed using the standard protocol during bolus administration of intravenous contrast. Multiplanar CT image reconstructions and MIPs were obtained to evaluate the vascular anatomy. Carotid stenosis measurements (when applicable) are obtained utilizing NASCET criteria, using the distal internal carotid diameter as the denominator. CONTRAST:  75mL  ISOVUE-370 IOPAMIDOL (ISOVUE-370) INJECTION 76% COMPARISON:  10/21/2016 head and neck CTA and brain MRI FINDINGS: CT HEAD FINDINGS Brain: There is no evidence of acute infarct, intracranial hemorrhage, mass, midline shift, or extra-axial fluid collection. The ventricles and sulci are normal. Vascular: Evaluated below. Skull: No fracture focal osseous lesion. Sinuses: Visualized paranasal sinuses and mastoid air cells are clear. Orbits: Unremarkable. Review of the MIP images confirms the above findings CTA NECK FINDINGS Aortic arch: Standard 3 vessel aortic arch. Widely patent arch vessel origins. Right carotid system: Patent without evidence of stenosis or dissection. Mild atheromatous wall thickening of the distal common and proximal internal carotid arteries. Left carotid system: Patent without evidence of stenosis or dissection. Mild atheromatous wall thickening of the distal common and proximal internal carotid arteries. Vertebral arteries: Patent and codominant without evidence of stenosis or dissection. Skeleton: No suspicious osseous lesion.  Mild cervical spondylosis. Other neck: Multiple small low-density thyroid nodules bilaterally as previously seen. Upper chest: Clear lung apices. Review of the MIP images confirms the above findings CTA HEAD FINDINGS Anterior circulation: The internal carotid arteries are widely patent from skull base to carotid termini. ACAs and MCAs are patent without evidence of proximal branch occlusion or significant proximal stenosis. No aneurysm is identified. Posterior circulation: The intracranial vertebral arteries are widely patent to the basilar. Patent PICA and SCA origins are visualized bilaterally. The basilar artery is widely patent. Posterior communicating arteries are not identified. The PCAs are patent without evidence of significant proximal stenosis. No aneurysm is identified. Venous sinuses: Patent. Anatomic variants: None. Delayed phase: No abnormal enhancement.  Review of the MIP images confirms the above findings IMPRESSION: 1. Patent intracranial and extracranial arterial vasculature without evidence of major branch occlusion, significant stenosis, or aneurysm. 2. Unremarkable CT appearance of the brain. Electronically Signed   By: Sebastian AcheAllen  Grady M.D.   On: 01/18/2018 09:22   Mr Brain Wo Contrast  Result Date: 01/18/2018 CLINICAL DATA:  Blurry vision with neck pain and left facial and arm numbness. EXAM: MRI HEAD WITHOUT CONTRAST TECHNIQUE: Multiplanar, multiecho pulse sequences of the brain and surrounding structures were obtained without intravenous contrast. COMPARISON:  Head CT/CTA 01/18/2018 FINDINGS: BRAIN: There is no acute infarct, acute hemorrhage or mass effect. The midline structures are normal. There are no old infarcts. The white matter signal is normal for the patient's age. The CSF spaces are normal for age, with no hydrocephalus. Susceptibility-sensitive sequences show no chronic microhemorrhage or superficial siderosis. VASCULAR: Major intracranial arterial and venous sinus flow voids are preserved. SKULL AND UPPER CERVICAL SPINE: The visualized skull base, calvarium, upper cervical spine and extracranial soft tissues are normal. SINUSES/ORBITS: No fluid levels or advanced mucosal thickening. No mastoid or middle ear effusion. The orbits are normal. IMPRESSION: Normal MRI of the brain. Electronically Signed   By: Deatra RobinsonKevin  Herman M.D.   On: 01/18/2018 18:55    Microbiology: No results found for this or any previous visit (from the past 240 hour(s)).   Labs: Basic Metabolic Panel: Recent Labs  Lab 01/18/18 0833 01/18/18 0839  NA 140 140  K 3.7 3.7  CL 106 104  CO2 27  --   GLUCOSE 94 92  BUN 21* 20  CREATININE  1.16 1.20  CALCIUM 9.2  --    Liver Function Tests: Recent Labs  Lab 01/18/18 0833  AST 18  ALT 27  ALKPHOS 104  BILITOT 0.7  PROT 7.2  ALBUMIN 4.0   No results for input(s): LIPASE, AMYLASE in the last 168 hours. No  results for input(s): AMMONIA in the last 168 hours. CBC: Recent Labs  Lab 01/18/18 0833 01/18/18 0839  WBC 9.0  --   NEUTROABS 5.5  --   HGB 14.9 14.6  HCT 43.8 43.0  MCV 89.4  --   PLT 205  --    Cardiac Enzymes: No results for input(s): CKTOTAL, CKMB, CKMBINDEX, TROPONINI in the last 168 hours. BNP: BNP (last 3 results) No results for input(s): BNP in the last 8760 hours.  ProBNP (last 3 results) No results for input(s): PROBNP in the last 8760 hours.  CBG: No results for input(s): GLUCAP in the last 168 hours.     Signed:  Chaya Jan  [facility] Pager: [phone] 01/19/2018, 5:03 PM

## 2018-01-19 NOTE — Progress Notes (Signed)
Patient's IV to left hand came out, patient refused to allow this RN to put another one back in.  Notified Dr. Ardyth HarpsHernandez who stated it was okay for patient not to have an IV, patient to be discharged today.

## 2018-01-19 NOTE — Progress Notes (Signed)
Reviewed AVS with patient, patient verbalized understanding.  Patient awaiting wife's arrival to transport home.

## 2018-01-19 NOTE — Progress Notes (Signed)
2D Echocardiogram has been performed.  Jeff Schultz 01/19/2018, 3:40 PM

## 2018-01-19 NOTE — Progress Notes (Signed)
OT Cancellation Note  Patient Details Name: Jeff GottronRichard C Igoe MRN: [id number] DOB: 06/05/1966   Cancelled Treatment:    Reason Eval/Treat Not Completed: OT screened, no needs identified, will sign off. Pt demonstrating BUE strength 5/5, coordination intact. Reports tingling in LUE, light touch sensation intact. Continues to report slightly blurry vision. Pt competing ADLs independently, no further OT services required at this time.    Ezra SitesLeslie Troxler, OTR/L  [phone] 01/19/2018, 8:54 AM

## 2018-01-25 DIAGNOSIS — E663 Overweight: Secondary | ICD-10-CM | POA: Diagnosis not present

## 2018-01-25 DIAGNOSIS — G43909 Migraine, unspecified, not intractable, without status migrainosus: Secondary | ICD-10-CM | POA: Diagnosis not present

## 2018-01-25 DIAGNOSIS — Z6827 Body mass index (BMI) 27.0-27.9, adult: Secondary | ICD-10-CM | POA: Diagnosis not present

## 2018-01-25 DIAGNOSIS — K529 Noninfective gastroenteritis and colitis, unspecified: Secondary | ICD-10-CM | POA: Diagnosis not present

## 2018-01-25 DIAGNOSIS — Z1389 Encounter for screening for other disorder: Secondary | ICD-10-CM | POA: Diagnosis not present

## 2018-01-27 ENCOUNTER — Encounter (INDEPENDENT_AMBULATORY_CARE_PROVIDER_SITE_OTHER): Payer: Self-pay | Admitting: *Deleted

## 2018-03-01 DIAGNOSIS — Z1389 Encounter for screening for other disorder: Secondary | ICD-10-CM | POA: Diagnosis not present

## 2018-03-01 DIAGNOSIS — R5382 Chronic fatigue, unspecified: Secondary | ICD-10-CM | POA: Diagnosis not present

## 2018-03-01 DIAGNOSIS — G43111 Migraine with aura, intractable, with status migrainosus: Secondary | ICD-10-CM | POA: Diagnosis not present

## 2018-03-01 DIAGNOSIS — G4733 Obstructive sleep apnea (adult) (pediatric): Secondary | ICD-10-CM | POA: Diagnosis not present

## 2018-03-01 DIAGNOSIS — G458 Other transient cerebral ischemic attacks and related syndromes: Secondary | ICD-10-CM | POA: Diagnosis not present

## 2018-03-01 DIAGNOSIS — E663 Overweight: Secondary | ICD-10-CM | POA: Diagnosis not present

## 2018-03-01 DIAGNOSIS — Z6826 Body mass index (BMI) 26.0-26.9, adult: Secondary | ICD-10-CM | POA: Diagnosis not present

## 2018-03-01 DIAGNOSIS — G43909 Migraine, unspecified, not intractable, without status migrainosus: Secondary | ICD-10-CM | POA: Diagnosis not present

## 2018-03-24 ENCOUNTER — Other Ambulatory Visit (HOSPITAL_BASED_OUTPATIENT_CLINIC_OR_DEPARTMENT_OTHER): Payer: Self-pay

## 2018-03-24 DIAGNOSIS — G4733 Obstructive sleep apnea (adult) (pediatric): Secondary | ICD-10-CM

## 2018-03-26 ENCOUNTER — Ambulatory Visit: Payer: BLUE CROSS/BLUE SHIELD | Attending: Neurology | Admitting: Neurology

## 2018-03-26 DIAGNOSIS — Z79899 Other long term (current) drug therapy: Secondary | ICD-10-CM | POA: Diagnosis not present

## 2018-03-26 DIAGNOSIS — R0683 Snoring: Secondary | ICD-10-CM | POA: Diagnosis not present

## 2018-03-26 DIAGNOSIS — G4733 Obstructive sleep apnea (adult) (pediatric): Secondary | ICD-10-CM | POA: Diagnosis not present

## 2018-03-27 NOTE — Procedures (Signed)
   [facility] Sergi Gellner A. Gerilyn Pilgrim, MD     [email or web address]             NOCTURNAL POLYSOMNOGRAPHY   LOCATION: [facility]  Patient Name: Jeff Schultz, Jeff Schultz Date: 03/26/2018 Gender: Male D.O.B: 11-04-1965 Age (years): 52 Referring Provider: Beryle Beams MD, ABSM Height (inches): 76 Interpreting Physician: Beryle Beams MD, ABSM Weight (lbs): 226 RPSGT: Peak, Robert BMI: 28 MRN: [id number] Neck Size: 16.00 CLINICAL INFORMATION Sleep Study Type: NPSG     Indication for sleep study: N/A     Epworth Sleepiness Score: NA     SLEEP STUDY TECHNIQUE As per the AASM Manual for the Scoring of Sleep and Associated Events v2.3 (April 2016) with a hypopnea requiring 4% desaturations.  The channels recorded and monitored were frontal, central and occipital EEG, electrooculogram (EOG), submentalis EMG (chin), nasal and oral airflow, thoracic and abdominal wall motion, anterior tibialis EMG, snore microphone, electrocardiogram, and pulse oximetry.  MEDICATIONS Medications self-administered by patient taken the night of the study : N/A  Current Outpatient Medications:  .  topiramate (TOPAMAX) 25 MG tablet, Take 1 tablet (25 mg total) by mouth daily., Disp: 30 tablet, Rfl: 2 .  vitamin B-12 1000 MCG tablet, Take 1 tablet (1,000 mcg total) by mouth daily., Disp: 30 tablet, Rfl: 3     SLEEP ARCHITECTURE The study was initiated at 9:49:28 PM and ended at 3:55:13 AM.  Sleep onset time was 5.7 minutes and the sleep efficiency was 92.8%%. The total sleep time was 339.5 minutes.  Stage REM latency was 77.5 minutes.  The patient spent 6.8%% of the night in stage N1 sleep, 65.5%% in stage N2 sleep, 11.2%% in stage N3 and 16.5% in REM.  Alpha intrusion was absent.  Supine sleep was 64.80%.  RESPIRATORY PARAMETERS The overall apnea/hypopnea index (AHI) was 3.7 per hour. There were 15 total apneas, including 0 obstructive, 15 central and 0 mixed apneas. There were 6  hypopneas and 7 RERAs.  The AHI during Stage REM sleep was 6.4 per hour.  AHI while supine was 3.5 per hour.  The mean oxygen saturation was 93.9%. The minimum SpO2 during sleep was 77.0%.  snoring was noted during this study.  CARDIAC DATA The 2 lead EKG demonstrated sinus rhythm. The mean heart rate was 55.8 beats per minute. Other EKG findings include: None. LEG MOVEMENT DATA The total PLMS were 0 with a resulting PLMS index of 0.0. Associated arousal with leg movement index was 0.0.  IMPRESSIONS 1. No significant obstructive sleep apnea occurred during this study. 2. No significant central sleep apnea occurred during this study.   Argie Ramming, MD Diplomate, American Board of Sleep Medicine.  ELECTRONICALLY SIGNED ON:  03/27/2018, 6:23 PM [facility] PH: [phone]   FX: [phone] ACCREDITED BY THE AMERICAN ACADEMY OF SLEEP MEDICINE

## 2018-04-28 DIAGNOSIS — R5382 Chronic fatigue, unspecified: Secondary | ICD-10-CM | POA: Diagnosis not present

## 2018-04-28 DIAGNOSIS — Z79899 Other long term (current) drug therapy: Secondary | ICD-10-CM | POA: Diagnosis not present

## 2018-04-28 DIAGNOSIS — G4701 Insomnia due to medical condition: Secondary | ICD-10-CM | POA: Diagnosis not present

## 2018-05-31 DIAGNOSIS — E538 Deficiency of other specified B group vitamins: Secondary | ICD-10-CM | POA: Diagnosis not present

## 2018-05-31 DIAGNOSIS — Z0000 Encounter for general adult medical examination without abnormal findings: Secondary | ICD-10-CM | POA: Diagnosis not present

## 2018-05-31 DIAGNOSIS — Z1389 Encounter for screening for other disorder: Secondary | ICD-10-CM | POA: Diagnosis not present

## 2018-05-31 DIAGNOSIS — Z6826 Body mass index (BMI) 26.0-26.9, adult: Secondary | ICD-10-CM | POA: Diagnosis not present

## 2018-05-31 DIAGNOSIS — Z0001 Encounter for general adult medical examination with abnormal findings: Secondary | ICD-10-CM | POA: Diagnosis not present

## 2018-06-21 DIAGNOSIS — G4701 Insomnia due to medical condition: Secondary | ICD-10-CM | POA: Diagnosis not present

## 2018-06-21 DIAGNOSIS — Z79899 Other long term (current) drug therapy: Secondary | ICD-10-CM | POA: Diagnosis not present

## 2018-06-21 DIAGNOSIS — R5382 Chronic fatigue, unspecified: Secondary | ICD-10-CM | POA: Diagnosis not present

## 2018-06-21 DIAGNOSIS — G43111 Migraine with aura, intractable, with status migrainosus: Secondary | ICD-10-CM | POA: Diagnosis not present

## 2018-07-23 DIAGNOSIS — K641 Second degree hemorrhoids: Secondary | ICD-10-CM | POA: Diagnosis not present

## 2018-07-23 DIAGNOSIS — Z1211 Encounter for screening for malignant neoplasm of colon: Secondary | ICD-10-CM | POA: Diagnosis not present

## 2018-12-08 DIAGNOSIS — Z886 Allergy status to analgesic agent status: Secondary | ICD-10-CM | POA: Diagnosis not present

## 2018-12-08 DIAGNOSIS — N201 Calculus of ureter: Secondary | ICD-10-CM | POA: Diagnosis not present

## 2018-12-08 DIAGNOSIS — N401 Enlarged prostate with lower urinary tract symptoms: Secondary | ICD-10-CM | POA: Diagnosis not present

## 2018-12-08 DIAGNOSIS — Z87442 Personal history of urinary calculi: Secondary | ICD-10-CM | POA: Diagnosis not present

## 2018-12-08 DIAGNOSIS — N200 Calculus of kidney: Secondary | ICD-10-CM | POA: Diagnosis not present

## 2018-12-08 DIAGNOSIS — Z880 Allergy status to penicillin: Secondary | ICD-10-CM | POA: Diagnosis not present

## 2018-12-08 DIAGNOSIS — N202 Calculus of kidney with calculus of ureter: Secondary | ICD-10-CM | POA: Diagnosis not present

## 2018-12-08 DIAGNOSIS — I700 Atherosclerosis of aorta: Secondary | ICD-10-CM | POA: Diagnosis not present

## 2018-12-08 DIAGNOSIS — N400 Enlarged prostate without lower urinary tract symptoms: Secondary | ICD-10-CM | POA: Diagnosis not present

## 2018-12-08 DIAGNOSIS — R109 Unspecified abdominal pain: Secondary | ICD-10-CM | POA: Diagnosis not present

## 2019-08-09 IMAGING — DX DG CHEST 2V
2 series · 2 of 2 positions shown · non-contrast
Comparison: 04/16/2017

CLINICAL DATA: Productive cough, shortness of Breath

EXAM:
CHEST  2 VIEW

[chest pa]
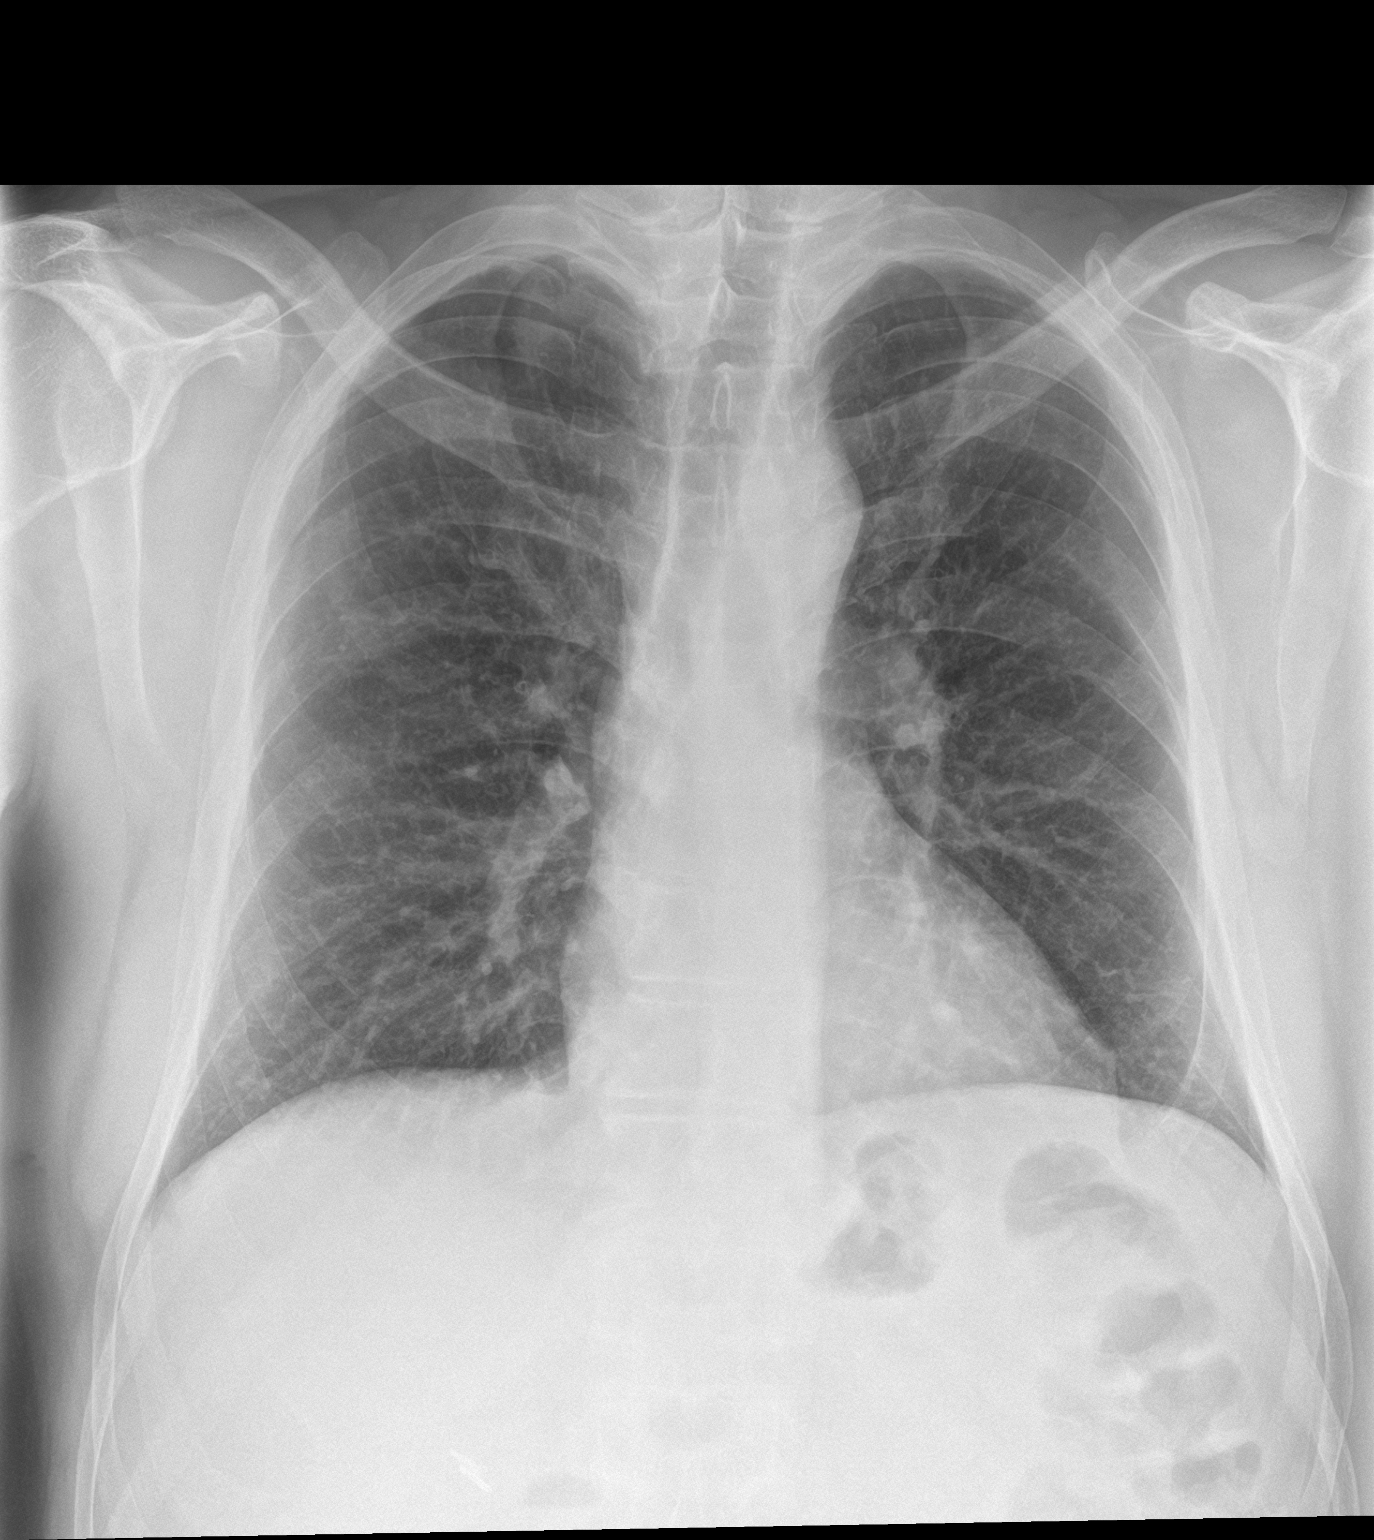

[chest lat]
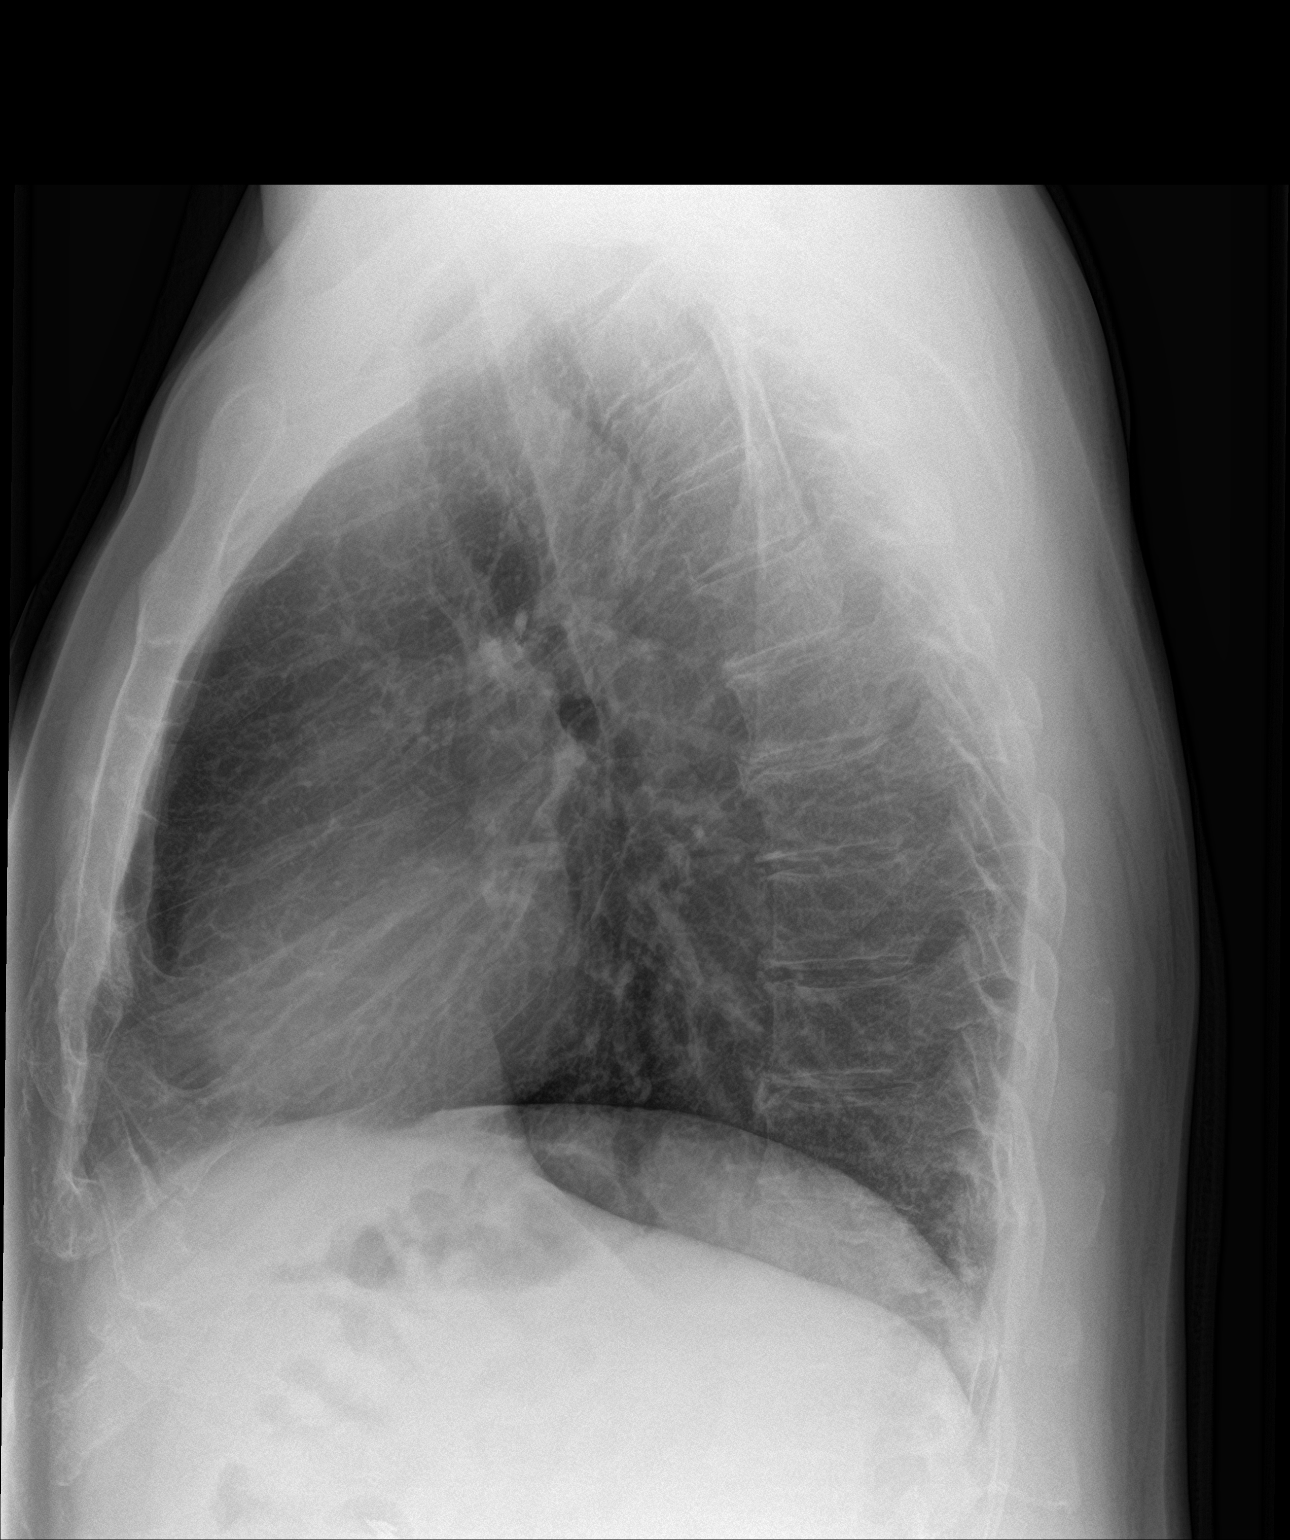

[2 of 2 positions shown; findings below may reference images not displayed]

FINDINGS: Heart and mediastinal contours are within normal limits. No focal
opacities or effusions. No acute bony abnormality.
IMPRESSION: No active cardiopulmonary disease.

## 2019-09-06 DIAGNOSIS — R1032 Left lower quadrant pain: Secondary | ICD-10-CM | POA: Diagnosis not present

## 2019-09-06 DIAGNOSIS — W57XXXA Bitten or stung by nonvenomous insect and other nonvenomous arthropods, initial encounter: Secondary | ICD-10-CM | POA: Diagnosis not present

## 2019-09-06 DIAGNOSIS — S70361A Insect bite (nonvenomous), right thigh, initial encounter: Secondary | ICD-10-CM | POA: Diagnosis not present

## 2019-09-06 DIAGNOSIS — R509 Fever, unspecified: Secondary | ICD-10-CM | POA: Diagnosis not present

## 2019-09-06 DIAGNOSIS — Z886 Allergy status to analgesic agent status: Secondary | ICD-10-CM | POA: Diagnosis not present

## 2019-09-06 DIAGNOSIS — Z885 Allergy status to narcotic agent status: Secondary | ICD-10-CM | POA: Diagnosis not present

## 2019-09-06 DIAGNOSIS — R519 Headache, unspecified: Secondary | ICD-10-CM | POA: Diagnosis not present

## 2019-09-06 DIAGNOSIS — Z880 Allergy status to penicillin: Secondary | ICD-10-CM | POA: Diagnosis not present

## 2019-09-06 DIAGNOSIS — Z87442 Personal history of urinary calculi: Secondary | ICD-10-CM | POA: Diagnosis not present

## 2020-01-05 DIAGNOSIS — H5319 Other subjective visual disturbances: Secondary | ICD-10-CM | POA: Diagnosis not present

## 2020-01-05 DIAGNOSIS — Z6826 Body mass index (BMI) 26.0-26.9, adult: Secondary | ICD-10-CM | POA: Diagnosis not present

## 2020-01-05 DIAGNOSIS — E663 Overweight: Secondary | ICD-10-CM | POA: Diagnosis not present

## 2020-01-05 DIAGNOSIS — Z1389 Encounter for screening for other disorder: Secondary | ICD-10-CM | POA: Diagnosis not present

## 2020-05-31 DIAGNOSIS — M5440 Lumbago with sciatica, unspecified side: Secondary | ICD-10-CM | POA: Diagnosis not present

## 2020-05-31 DIAGNOSIS — E119 Type 2 diabetes mellitus without complications: Secondary | ICD-10-CM | POA: Diagnosis not present

## 2020-06-01 DIAGNOSIS — M4727 Other spondylosis with radiculopathy, lumbosacral region: Secondary | ICD-10-CM | POA: Diagnosis not present

## 2020-06-18 DIAGNOSIS — R32 Unspecified urinary incontinence: Secondary | ICD-10-CM | POA: Diagnosis not present

## 2020-06-18 DIAGNOSIS — M5441 Lumbago with sciatica, right side: Secondary | ICD-10-CM | POA: Diagnosis not present

## 2020-06-25 DIAGNOSIS — M5441 Lumbago with sciatica, right side: Secondary | ICD-10-CM | POA: Diagnosis not present

## 2020-07-02 DIAGNOSIS — M4726 Other spondylosis with radiculopathy, lumbar region: Secondary | ICD-10-CM | POA: Diagnosis not present

## 2020-07-02 DIAGNOSIS — M5117 Intervertebral disc disorders with radiculopathy, lumbosacral region: Secondary | ICD-10-CM | POA: Diagnosis not present

## 2020-07-02 DIAGNOSIS — M4727 Other spondylosis with radiculopathy, lumbosacral region: Secondary | ICD-10-CM | POA: Diagnosis not present

## 2020-07-02 DIAGNOSIS — M5116 Intervertebral disc disorders with radiculopathy, lumbar region: Secondary | ICD-10-CM | POA: Diagnosis not present

## 2020-07-16 DIAGNOSIS — U071 COVID-19: Secondary | ICD-10-CM | POA: Diagnosis not present

## 2020-07-27 DIAGNOSIS — M5441 Lumbago with sciatica, right side: Secondary | ICD-10-CM | POA: Diagnosis not present

## 2020-07-31 DIAGNOSIS — M5441 Lumbago with sciatica, right side: Secondary | ICD-10-CM | POA: Diagnosis not present

## 2020-08-10 DIAGNOSIS — M5417 Radiculopathy, lumbosacral region: Secondary | ICD-10-CM | POA: Diagnosis not present

## 2020-08-17 ENCOUNTER — Encounter: Payer: Self-pay | Admitting: Emergency Medicine

## 2020-08-17 ENCOUNTER — Ambulatory Visit
Admission: EM | Admit: 2020-08-17 | Discharge: 2020-08-17 | Disposition: A | Discharge status: Home / Self Care | Payer: BC Managed Care – PPO | Attending: Emergency Medicine | Admitting: Emergency Medicine

## 2020-08-17 ENCOUNTER — Other Ambulatory Visit: Payer: Self-pay

## 2020-08-17 DIAGNOSIS — R6889 Other general symptoms and signs: Secondary | ICD-10-CM | POA: Diagnosis not present

## 2020-08-17 NOTE — ED Triage Notes (Signed)
Needs covid test for a procedure 

## 2020-08-18 LAB — SARS-COV-2, NAA 2 DAY TAT

## 2020-08-18 LAB — NOVEL CORONAVIRUS, NAA: SARS-CoV-2, NAA: NOT DETECTED

## 2021-03-19 ENCOUNTER — Encounter: Payer: Self-pay | Admitting: Physical Medicine and Rehabilitation

## 2021-05-09 ENCOUNTER — Ambulatory Visit (INDEPENDENT_AMBULATORY_CARE_PROVIDER_SITE_OTHER): Payer: Self-pay | Admitting: "Endocrinology

## 2021-05-09 ENCOUNTER — Other Ambulatory Visit: Payer: Self-pay

## 2021-05-09 ENCOUNTER — Encounter: Payer: Self-pay | Admitting: "Endocrinology

## 2021-05-09 ENCOUNTER — Other Ambulatory Visit (HOSPITAL_COMMUNITY)
Admission: RE | Admit: 2021-05-09 | Discharge: 2021-05-09 | Disposition: A | Discharge status: Home / Self Care | Payer: Self-pay | Source: Ambulatory Visit | Attending: "Endocrinology | Admitting: "Endocrinology

## 2021-05-09 VITALS — BP 124/81 | HR 69 | Ht 74.0 in | Wt 226.0 lb

## 2021-05-09 DIAGNOSIS — E042 Nontoxic multinodular goiter: Secondary | ICD-10-CM

## 2021-05-09 LAB — MAGNESIUM: Magnesium: 2.3 mg/dL (ref 1.7–2.4)

## 2021-05-09 LAB — T4, FREE: Free T4: 0.92 ng/dL (ref 0.61–1.12)

## 2021-05-09 LAB — PHOSPHORUS: Phosphorus: 2.6 mg/dL (ref 2.5–4.6)

## 2021-05-09 LAB — TSH: TSH: 0.992 u[IU]/mL (ref 0.350–4.500)

## 2021-05-09 NOTE — Progress Notes (Signed)
Endocrinology Consult Note                                            05/09/2021, 9:25 PM   Subjective:    Patient ID: Jeff Schultz, male    DOB: Feb 26, 1966, PCP Bucio, Julian Reil, FNP   Past Medical History:  Diagnosis Date   B12 deficiency    Hemopneumothorax    Hyperlipidemia    Jaw fracture (HCC)    Skull fracture (HCC)    Past Surgical History:  Procedure Laterality Date   CHEST TUBE INSERTION     collapsed lung     Social History   Socioeconomic History   Marital status: Married    Spouse name: Not on file   Number of children: 1   Years of education: Not on file   Highest education level: Not on file  Occupational History   Occupation: truck driver  Tobacco Use   Smoking status: Never   Smokeless tobacco: Current    Types: Snuff  Vaping Use   Vaping Use: Never used  Substance and Sexual Activity   Alcohol use: Yes    Comment: occasional   Drug use: No   Sexual activity: Never  Other Topics Concern   Not on file  Social History Narrative   Not on file   Social Determinants of Health   Financial Resource Strain: Not on file  Food Insecurity: Not on file  Transportation Needs: Not on file  Physical Activity: Not on file  Stress: Not on file  Social Connections: Not on file   Family History  Problem Relation Age of Onset   Cancer Other    Diabetes Other    Heart attack Other    Stroke Other    Outpatient Encounter Medications as of 05/09/2021  Medication Sig   gabapentin (NEURONTIN) 300 MG capsule Take 600 mg by mouth at bedtime.   amitriptyline (ELAVIL) 10 MG tablet Take 10 mg by mouth at bedtime.   atorvastatin (LIPITOR) 10 MG tablet Take 10 mg by mouth daily.   [DISCONTINUED] topiramate (TOPAMAX) 25 MG tablet Take 1 tablet (25 mg total) by mouth daily.   [DISCONTINUED] vitamin B-12 1000 MCG tablet Take 1 tablet (1,000 mcg total) by mouth daily.   No facility-administered encounter medications on file as of 05/09/2021.    ALLERGIES: Allergies  Allergen Reactions   Penicillins Anaphylaxis    Has patient had a PCN reaction causing immediate rash, facial/tongue/throat swelling, SOB or lightheadedness with hypotension: Yes Has patient had a PCN reaction causing severe rash involving mucus membranes or skin necrosis: Yes Has patient had a PCN reaction that required hospitalization No Has patient had a PCN reaction occurring within the last 10 years: No If all of the above answers are "NO", then may proceed with Cephalosporin use.    Hydrocodone Itching    VACCINATION STATUS:  There is no immunization history on file for this patient.  HPI Jeff Schultz is 55 y.o. male who presents today with a medical history as above. he is being seen in consultation for multinodular goiter, hypercalcemia requested by Bucio, Julian Reil, FNP.   Patient is accompanied by his wife to clinic.  History is obtained directly from the patient as well as chart review. Review of ultrasound done at [facility] in September showed that he has subcentimeter  scattered thyroid nodules in bilateral thyroid lobes, approximately 1.4 cm TI-RADS category 4 nodule in the right inferior gland meets criteria to warrant imaging surveillance.  Patient denies any prior history of thyroid dysfunction.  However patient reports history of thyroid malignancy in his mother in her 63s. He does not know the details of the cancer diagnosis.  He denies dysphagia, shortness of breath, nor voice change. He has previous smoking history, currently tobacco user by chewing.  He does not report any recent major changes in his weight.  He was also found to have mild hypercalcemia on 2 occasions since August 2022.  Highest calcium level has been 10.6.  No associated PTH. He denies any exposure to neck radiation. Patient on polypharmacy related to his multiple injuries to his back as well as lower extremities.  He ambulates with a cane.   Review of  Systems  Constitutional: no recent weight gain/loss, + fatigue, no subjective hyperthermia, no subjective hypothermia Eyes: no blurry vision, no xerophthalmia ENT: no sore throat, no nodules palpated in throat, no dysphagia/odynophagia, no hoarseness Cardiovascular: no Chest Pain, no Shortness of Breath, no palpitations, no leg swelling Respiratory: no cough, no shortness of breath Gastrointestinal: no Nausea/Vomiting/Diarhhea Musculoskeletal: no muscle/joint aches, + ambulates with a cane. Skin: no rashes Neurological: no tremors, no numbness, no tingling, no dizziness Psychiatric: no depression, no anxiety  Objective:    Vitals with BMI 05/09/2021 01/19/2018 01/19/2018  Height 6\' 2"  - -  Weight 226 lbs - -  BMI 29 - -  Systolic 124 144  Diastolic 81 82 83  Pulse 69 77 83    BP 124/81   Pulse 69   Ht 6\' 2"  (1.88 m)   Wt 226 lb (102.5 kg)   BMI 29.02 kg/m   Wt Readings from Last 3 Encounters:  05/09/21 226 lb (102.5 kg)  01/18/18 223 lb 15.8 oz (101.6 kg)  07/27/17 226 lb (102.5 kg)    Physical Exam  Constitutional:  Body mass index is 29.02 kg/m.,  not in acute distress, normal state of mind Eyes: PERRLA, EOMI, no exophthalmos ENT: moist mucous membranes, +gross thyromegaly, no gross cervical lymphadenopathy Cardiovascular: normal precordial activity, Regular Rate and Rhythm, no Murmur/Rubs/Gallops Respiratory:  adequate breathing efforts, no gross chest deformity, Clear to auscultation bilaterally Gastrointestinal: abdomen soft, Non -tender, No distension, Bowel Sounds present, no gross organomegaly Musculoskeletal: no gross deformities, strength intact in all four extremities, + ambulates with a cane. Skin: moist, warm, no rashes Neurological: no tremor with outstretched hands, Deep tendon reflexes normal in bilateral lower extremities.  CMP ( most recent) CMP     Component Value Date/Time   NA 140 01/18/2018 0839   K 3.7 01/18/2018 0839   CL 104 01/18/2018  0839   CO2 27 01/18/2018 0833   GLUCOSE 92 01/18/2018 0839   BUN 20 01/18/2018 0839   CREATININE 1.20 01/18/2018 0839   CALCIUM 9.2 01/18/2018 0833   PROT 7.2 01/18/2018 0833   ALBUMIN 4.0 01/18/2018 0833   AST 18 01/18/2018 0833   ALT 27 01/18/2018 0833   ALKPHOS 104 01/18/2018 0833   BILITOT 0.7 01/18/2018 0833   GFRNONAA >60 01/18/2018 0833   GFRAA >60 01/18/2018 0833     Diabetic Labs (most recent): Lab Results  Component Value Date   HGBA1C 5.6 01/19/2018   HGBA1C 5.4 10/22/2016     Lipid Panel ( most recent) Lipid Panel     Component Value Date/Time   CHOL 169 01/19/2018 0519  TRIG 206 (H) 01/19/2018 0519   HDL 24 (L) 01/19/2018 0519   CHOLHDL 7.0 01/19/2018 0519   VLDL 41 (H) 01/19/2018 0519   LDLCALC 104 (H) 01/19/2018 0519      Lab Results  Component Value Date   TSH 0.992 05/09/2021   TSH 0.308 (L) 10/22/2016   FREET4 0.92 05/09/2021          Ultrasound of his thyroid from March 08, 2021 Right lobe: 6.2 x 1.5 x 2.3 cm ,Left lobe: 5.7 x 1.1 x 2.1 cm   1.4 cm TI-RADS category 4 nodule in the right inferior gland meets criteria to warrant imaging surveillance.   Assessment & Plan:   1. Multinodular goiter 2. Hypercalcemia   - BRONC BROSSEAU  is being seen at a kind request of Bucio, Elsa C, FNP. - I have reviewed his available green records and clinically evaluated the patient. - Based on these reviews, he has multinodular goiter, with a dominant nodule on the right lobe, and persistent, mild hypercalcemia.  It is not clear if these 2 conditions are related yet.  In light of his first-degree relative with thyroid cancer, smoking/tobacco exposure, this patient will be offered fine-needle aspiration of the largest nodule in the right lobe to rule out thyroid malignancy.  He will be sent to lab for more complete thyroid function test as well as work-up for hypercalcemia.  His labs will include PTH/calcium, magnesium, phosphorus, vitamin D  panel. He will return in 2 weeks to discuss his results.  If he is found to have significant hypercalcemia, will be considered for 24-hour urine collection for calcium measurement. - I did not initiate any new prescriptions today. - he is advised to maintain close follow up with Bucio, Julian Reil, FNP for primary care needs.   - Time spent with the patient: 60 minutes, of which >50% was spent in  counseling him about his multinodular goiter, hypercalcemia and the rest in obtaining information about his symptoms, reviewing his previous labs/studies ( including abstractions from other facilities),  evaluations, and treatments,  and developing a plan to confirm diagnosis and long term treatment based on the latest standards of care/guidelines; and documenting his care.  Jeff Schultz participated in the discussions, expressed understanding, and voiced agreement with the above plans.  All questions were answered to his satisfaction. he is encouraged to contact clinic should he have any questions or concerns prior to his return visit.  Follow up plan: Return in about 2 weeks (around 05/23/2021) for Labs Today- Non-Fasting Ok, F/U with Biopsy Results.   Marquis Lunch, MD [facility] [address] [place], [place] [address] Phone: [phone]  Fax: [phone]     05/09/2021, 9:25 PM  This note was partially dictated with voice recognition software. Similar sounding words can be transcribed inadequately or may not  be corrected upon review.

## 2021-05-10 LAB — PTH, INTACT AND CALCIUM
Calcium, Total (PTH): 10.1 mg/dL (ref 8.7–10.2)
PTH: 15 pg/mL (ref 15–65)

## 2021-05-10 LAB — T3, FREE: T3, Free: 3.2 pg/mL (ref 2.0–4.4)

## 2021-05-10 LAB — THYROID PEROXIDASE ANTIBODY: Thyroperoxidase Ab SerPl-aCnc: <9 IU/mL (ref 0–34)

## 2021-05-11 LAB — THYROGLOBULIN ANTIBODY: Thyroglobulin Antibody: <1 IU/mL (ref 0.0–0.9)

## 2021-05-15 ENCOUNTER — Telehealth: Payer: Self-pay | Admitting: "Endocrinology

## 2021-05-15 NOTE — Telephone Encounter (Signed)
Received medical records request from [facility], sent to [employer] as our templates are not built yet for [place] to process in office.

## 2021-05-24 ENCOUNTER — Encounter: Payer: Self-pay | Admitting: Physical Medicine and Rehabilitation

## 2021-05-27 ENCOUNTER — Ambulatory Visit: Payer: Medicaid Other | Admitting: "Endocrinology

## 2021-05-30 ENCOUNTER — Encounter (HOSPITAL_COMMUNITY): Payer: Self-pay

## 2021-05-30 ENCOUNTER — Ambulatory Visit (HOSPITAL_COMMUNITY)
Admission: RE | Admit: 2021-05-30 | Discharge: 2021-05-30 | Disposition: A | Discharge status: Home / Self Care | Payer: Self-pay | Source: Ambulatory Visit | Attending: "Endocrinology | Admitting: "Endocrinology

## 2021-05-30 ENCOUNTER — Other Ambulatory Visit: Payer: Self-pay

## 2021-05-30 DIAGNOSIS — E042 Nontoxic multinodular goiter: Secondary | ICD-10-CM

## 2021-05-30 DIAGNOSIS — E041 Nontoxic single thyroid nodule: Secondary | ICD-10-CM | POA: Insufficient documentation

## 2021-05-30 MED ORDER — LIDOCAINE HCL (PF) 2 % IJ SOLN
INTRAMUSCULAR | Status: AC
  Filled 2021-05-30: qty 20

## 2021-05-30 MED ORDER — LIDOCAINE HCL (PF) 2 % IJ SOLN
10.0000 mL | Freq: Once | INTRAMUSCULAR | Status: AC
  Administered 2021-05-30: 10 mL

## 2021-05-30 NOTE — Progress Notes (Signed)
PT tolerated thyroid biopsy procedure well today. Labs and afirma obtained and sent for pathology at this time by ultrasound tech. PT ambulatory at discharge with no acute distress noted, verbalized understanding of discharge instructions and given an ice pack.

## 2021-05-31 LAB — CYTOLOGY - NON PAP

## 2021-06-04 ENCOUNTER — Other Ambulatory Visit: Payer: Self-pay

## 2021-06-04 ENCOUNTER — Ambulatory Visit (INDEPENDENT_AMBULATORY_CARE_PROVIDER_SITE_OTHER): Payer: Self-pay | Admitting: "Endocrinology

## 2021-06-04 ENCOUNTER — Encounter: Payer: Self-pay | Admitting: "Endocrinology

## 2021-06-04 VITALS — BP 116/78 | HR 96 | Ht 74.0 in | Wt 228.0 lb

## 2021-06-04 DIAGNOSIS — E042 Nontoxic multinodular goiter: Secondary | ICD-10-CM

## 2021-06-04 NOTE — Progress Notes (Signed)
06/04/2021, 3:49 PM  Endocrinology follow-up note   Subjective:    Patient ID: Jeff Schultz, male    DOB: 1966/05/25, PCP Bucio, Julian Reil, FNP   Past Medical History:  Diagnosis Date   B12 deficiency    Hemopneumothorax    Hyperlipidemia    Jaw fracture (HCC)    Skull fracture (HCC)    Past Surgical History:  Procedure Laterality Date   BIOPSY THYROID     CHEST TUBE INSERTION     collapsed lung     Social History   Socioeconomic History   Marital status: Married    Spouse name: Not on file   Number of children: 1   Years of education: Not on file   Highest education level: Not on file  Occupational History   Occupation: truck driver  Tobacco Use   Smoking status: Never   Smokeless tobacco: Current    Types: Snuff  Vaping Use   Vaping Use: Never used  Substance and Sexual Activity   Alcohol use: Yes    Comment: occasional   Drug use: No   Sexual activity: Never  Other Topics Concern   Not on file  Social History Narrative   Not on file   Social Determinants of Health   Financial Resource Strain: Not on file  Food Insecurity: Not on file  Transportation Needs: Not on file  Physical Activity: Not on file  Stress: Not on file  Social Connections: Not on file   Family History  Problem Relation Age of Onset   Cancer Other    Diabetes Other    Heart attack Other    Stroke Other    Outpatient Encounter Medications as of 06/04/2021  Medication Sig   amitriptyline (ELAVIL) 10 MG tablet Take 10 mg by mouth at bedtime.   atorvastatin (LIPITOR) 10 MG tablet Take 10 mg by mouth daily.   gabapentin (NEURONTIN) 300 MG capsule Take 600 mg by mouth at bedtime.   No facility-administered encounter medications on file as of 06/04/2021.   ALLERGIES: Allergies  Allergen Reactions   Penicillins Anaphylaxis    Has patient had a PCN reaction causing immediate rash, facial/tongue/throat swelling, SOB or  lightheadedness with hypotension: Yes Has patient had a PCN reaction causing severe rash involving mucus membranes or skin necrosis: Yes Has patient had a PCN reaction that required hospitalization No Has patient had a PCN reaction occurring within the last 10 years: No If all of the above answers are "NO", then may proceed with Cephalosporin use.    Hydrocodone Itching    VACCINATION STATUS:  There is no immunization history on file for this patient.  Jeff Schultz is 55 y.o. male who presents today with a medical history as above. he is being seen in consultation for multinodular goiter, hypercalcemia requested by Bucio, Julian Reil, FNP.   Patient is accompanied by his wife to clinic.  History is obtained directly from the patient as well as chart review. Review of ultrasound done at [facility] in September showed that he has subcentimeter scattered thyroid nodules in bilateral thyroid lobes, approximately 1.4 cm TI-RADS category 4 nodule in the right inferior gland . He was sent for fine-needle aspiration biopsy of this nodule which  returned as benign findings.    Patient denies any prior history of thyroid dysfunction.  However patient reports history of thyroid malignancy in his mother in her 46s. He does not know the details of the cancer diagnosis.  He reports, intermittent mild difficulty swallowing.  He denies shortness of breath, nor voice change.    He has previous smoking history, currently tobacco user by chewing.  He does not report any recent major changes in his weight.  He was also found to have mild hypercalcemia on 2 occasions since August 2022.  Highest calcium level has been 10.6.  His repeat labs showed normal calcium and PTH.    He denies any exposure to neck radiation. Patient on polypharmacy related to his multiple injuries to his back as well as lower extremities.  He ambulates with a cane.   Review of Systems  Constitutional: no recent weight  gain/loss, + fatigue, no subjective hyperthermia, no subjective hypothermia   Objective:    Vitals with BMI 06/04/2021 05/30/2021 05/09/2021  Height 6\' 2"  - 6\' 2"   Weight 228 lbs - 226 lbs  BMI 29.26 - 29  Systolic 116 125  Diastolic 78 85 81  Pulse 96 72 69    BP 116/78    Pulse 96    Ht 6\' 2"  (1.88 m)    Wt 228 lb (103.4 kg)    BMI 29.27 kg/m   Wt Readings from Last 3 Encounters:  06/04/21 228 lb (103.4 kg)  05/09/21 226 lb (102.5 kg)  01/18/18 223 lb 15.8 oz (101.6 kg)    Physical Exam  Constitutional:  Body mass index is 29.27 kg/m.,  not in acute distress, normal state of mind   CMP ( most recent) CMP     Component Value Date/Time   NA 140 01/18/2018 0839   K 3.7 01/18/2018 0839   CL 104 01/18/2018 0839   CO2 27 01/18/2018 0833   GLUCOSE 92 01/18/2018 0839   BUN 20 01/18/2018 0839   CREATININE 1.20 01/18/2018 0839   CALCIUM 10.1 05/09/2021 1145   PROT 7.2 01/18/2018 0833   ALBUMIN 4.0 01/18/2018 0833   AST 18 01/18/2018 0833   ALT 27 01/18/2018 0833   ALKPHOS 104 01/18/2018 0833   BILITOT 0.7 01/18/2018 0833   GFRNONAA >60 01/18/2018 0833   GFRAA >60 01/18/2018 0833     Diabetic Labs (most recent): Lab Results  Component Value Date   HGBA1C 5.6 01/19/2018   HGBA1C 5.4 10/22/2016     Lipid Panel ( most recent) Lipid Panel     Component Value Date/Time   CHOL 169 01/19/2018 0519   TRIG 206 (H) 01/19/2018 0519   HDL 24 (L) 01/19/2018 0519   CHOLHDL 7.0 01/19/2018 0519   VLDL 41 (H) 01/19/2018 0519   LDLCALC 104 (H) 01/19/2018 0519      Lab Results  Component Value Date   TSH 0.992 05/09/2021   TSH 0.308 (L) 10/22/2016   FREET4 0.92 05/09/2021          Ultrasound of his thyroid from March 08, 2021 Right lobe: 6.2 x 1.5 x 2.3 cm ,Left lobe: 5.7 x 1.1 x 2.1 cm   1.4 cm TI-RADS category 4 nodule in the right inferior gland meets criteria to warrant imaging surveillance.    Fine-needle aspiration biopsy on May 30, 2021. Clinical History: 1.4 cm RLL  Specimen Submitted:  A. THYROID, RIGHT, FINE NEEDLE ASPIRATION:  FINAL MICROSCOPIC DIAGNOSIS:  - Consistent with benign follicular nodule (  [place] category II)  Assessment & Plan:   1. Multinodular goiter-benign cytology 2. Hypercalcemia-resolved  I discussed his biopsy results and labs with him and his wife. -He does not have malignancy.  His biopsy results are benign.  He will not need surgical intervention at this time.  He will be considered for repeat thyroid ultrasound and thyroid function test in a year with office visit.  - I did not initiate any new prescriptions today. - he is advised to maintain close follow up with Bucio, Julian Reil, FNP for primary care needs.   I spent 25 minutes in the care of the patient today including review of labs from Thyroid Function, CMP, and other relevant labs ; imaging/biopsy records (current and previous including abstractions from other facilities); face-to-face time discussing  his lab results and symptoms, medications doses, his options of short and long term treatment based on the latest standards of care / guidelines;   and documenting the encounter.  Jeff Schultz  participated in the discussions, expressed understanding, and voiced agreement with the above plans.  All questions were answered to his satisfaction. he is encouraged to contact clinic should he have any questions or concerns prior to his return visit.   Follow up plan: Return in about 1 year (around 06/04/2022) for F/U with Pre-visit Labs, Thyroid / Neck Ultrasound.   Marquis Lunch, MD [facility] [address] [place], [place] [address] Phone: [phone]  Fax: [phone]     06/04/2021, 3:49 PM  This note was partially dictated with voice recognition software. Similar sounding words can be transcribed inadequately or may not  be corrected upon review.

## 2021-06-07 ENCOUNTER — Encounter: Payer: Self-pay | Admitting: Physical Medicine and Rehabilitation

## 2021-08-02 ENCOUNTER — Encounter: Payer: Self-pay | Admitting: Physical Medicine and Rehabilitation

## 2022-05-17 ENCOUNTER — Encounter (HOSPITAL_COMMUNITY): Payer: Self-pay | Admitting: Emergency Medicine

## 2022-05-17 ENCOUNTER — Other Ambulatory Visit: Payer: Self-pay

## 2022-05-17 ENCOUNTER — Emergency Department (HOSPITAL_COMMUNITY)
Admission: EM | Admit: 2022-05-17 | Discharge: 2022-05-17 | Discharge status: Against Medical Advice | Payer: Self-pay | Attending: Emergency Medicine | Admitting: Emergency Medicine

## 2022-05-17 ENCOUNTER — Emergency Department (HOSPITAL_COMMUNITY): Payer: Self-pay

## 2022-05-17 DIAGNOSIS — R1013 Epigastric pain: Secondary | ICD-10-CM

## 2022-05-17 DIAGNOSIS — N200 Calculus of kidney: Secondary | ICD-10-CM

## 2022-05-17 DIAGNOSIS — N132 Hydronephrosis with renal and ureteral calculous obstruction: Secondary | ICD-10-CM | POA: Insufficient documentation

## 2022-05-17 HISTORY — DX: Disorder of parathyroid gland, unspecified: E21.5

## 2022-05-17 HISTORY — DX: Allergy to other foods: Z91.018

## 2022-05-17 LAB — COMPREHENSIVE METABOLIC PANEL
ALT: 23 U/L (ref 0–44)
AST: 18 U/L (ref 15–41)
Albumin: 4.1 g/dL (ref 3.5–5.0)
Alkaline Phosphatase: 110 U/L (ref 38–126)
Anion gap: 9 (ref 5–15)
BUN: 14 mg/dL (ref 6–20)
CO2: 29 mmol/L (ref 22–32)
Calcium: 9.9 mg/dL (ref 8.9–10.3)
Chloride: 101 mmol/L (ref 98–111)
Creatinine, Ser: 0.94 mg/dL (ref 0.61–1.24)
GFR, Estimated: >60 mL/min (ref >60)
Glucose, Bld: 109 mg/dL — ABNORMAL HIGH (ref 70–99)
Potassium: 3.7 mmol/L (ref 3.5–5.1)
Sodium: 139 mmol/L (ref 135–145)
Total Bilirubin: 0.5 mg/dL (ref 0.3–1.2)
Total Protein: 8.1 g/dL (ref 6.5–8.1)

## 2022-05-17 LAB — CBC
HCT: 44.3 % (ref 39.0–52.0)
Hemoglobin: 15 g/dL (ref 13.0–17.0)
MCH: 30.2 pg (ref 26.0–34.0)
MCHC: 33.9 g/dL (ref 30.0–36.0)
MCV: 89.1 fL (ref 80.0–100.0)
Platelets: 242 10*3/uL (ref 150–400)
RBC: 4.97 MIL/uL (ref 4.22–5.81)
RDW: 12.5 % (ref 11.5–15.5)
WBC: 10 10*3/uL (ref 4.0–10.5)
nRBC: 0 % (ref 0.0–0.2)

## 2022-05-17 LAB — URINALYSIS, ROUTINE W REFLEX MICROSCOPIC
Bacteria, UA: NONE SEEN
Bilirubin Urine: NEGATIVE
Glucose, UA: NEGATIVE mg/dL
Ketones, ur: NEGATIVE mg/dL
Leukocytes,Ua: NEGATIVE
Nitrite: NEGATIVE
Protein, ur: NEGATIVE mg/dL
Specific Gravity, Urine: 1.002 — ABNORMAL LOW (ref 1.005–1.030)
pH: 7 (ref 5.0–8.0)

## 2022-05-17 LAB — LIPASE, BLOOD: Lipase: 27 U/L (ref 11–51)

## 2022-05-17 MED ORDER — FENTANYL CITRATE PF 50 MCG/ML IJ SOSY
50.0000 ug | PREFILLED_SYRINGE | Freq: Once | INTRAMUSCULAR | Status: DC
  Filled 2022-05-17: qty 1

## 2022-05-17 MED ORDER — IOHEXOL 300 MG/ML  SOLN
100.0000 mL | Freq: Once | INTRAMUSCULAR | Status: AC | PRN
  Administered 2022-05-17: 100 mL via INTRAVENOUS

## 2022-05-17 MED ORDER — ONDANSETRON HCL 4 MG/2ML IJ SOLN
4.0000 mg | Freq: Once | INTRAMUSCULAR | Status: AC
  Administered 2022-05-17: 4 mg via INTRAVENOUS
  Filled 2022-05-17: qty 2

## 2022-05-17 MED ORDER — SODIUM CHLORIDE 0.9 % IV SOLN: INTRAVENOUS | Status: DC

## 2022-05-17 MED ORDER — KETOROLAC TROMETHAMINE 30 MG/ML IJ SOLN
30.0000 mg | Freq: Once | INTRAMUSCULAR | Status: AC
Start: 2022-05-17 — End: 2022-05-17
  Administered 2022-05-17: 30 mg via INTRAVENOUS
  Filled 2022-05-17: qty 1

## 2022-05-17 NOTE — ED Provider Notes (Signed)
[facility] EMERGENCY DEPARTMENT Provider Note   CSN: [id number] Arrival date & time: 05/17/22  1617     History  Chief Complaint  Patient presents with   Abdominal Pain    PHUOC HUY is a 56 y.o. male.   Abdominal Pain    This patient is a 56 year old male, history of alpha gal, history of high cholesterol and a prior history of a cholecystectomy as well as peptic ulcer disease that he reports was diagnosed on a barium swallow and a subsequent endoscopy.  He has no history of alcohol use or pancreatitis.  I have reviewed the medical record and find no evidence of prior GI evaluations.  He presents to the hospital stating that after eating breakfast this morning which was [place] bacon he developed severe abdominal discomfort, this gradually improved and he took a nap when he woke up he felt better but then had a tomato sandwich which caused again recurrent severe abdominal pain and now has vomited at least 4 times since that time.  His pain is persistent, located in the epigastrium right and left upper quadrants and does not radiate into the chest or to the back.  He denies use of NSAIDs  Home Medications Prior to Admission medications   Medication Sig Start Date End Date Taking? Authorizing Provider  amitriptyline (ELAVIL) 10 MG tablet Take 10 mg by mouth at bedtime. 03/25/21   [provider]  atorvastatin (LIPITOR) 10 MG tablet Take 10 mg by mouth daily. 03/13/21   [provider]  gabapentin (NEURONTIN) 300 MG capsule Take 600 mg by mouth at bedtime. 08/10/20   [provider]      Allergies    Penicillins, Fentanyl, and Hydrocodone    Review of Systems   Review of Systems  Gastrointestinal:  Positive for abdominal pain.  All other systems reviewed and are negative.   Physical Exam Updated Vital Signs BP 134/88   Pulse 63   Temp 98.1 F (36.7 C) (Oral)   Resp 16   Ht 1.956 m (6\' 5" )   Wt 92.5 kg   SpO2 99%   BMI 24.19 kg/m   Physical Exam Vitals and nursing note reviewed.  Constitutional:      General: He is not in acute distress.    Appearance: He is well-developed.  HENT:     Head: Normocephalic and atraumatic.     Mouth/Throat:     Pharynx: No oropharyngeal exudate.  Eyes:     General: No scleral icterus.       Right eye: No discharge.        Left eye: No discharge.     Conjunctiva/sclera: Conjunctivae normal.     Pupils: Pupils are equal, round, and reactive to light.  Neck:     Thyroid: No thyromegaly.     Vascular: No JVD.  Cardiovascular:     Rate and Rhythm: Normal rate and regular rhythm.     Heart sounds: Normal heart sounds. No murmur heard.    No friction rub. No gallop.  Pulmonary:     Effort: Pulmonary effort is normal. No respiratory distress.     Breath sounds: Normal breath sounds. No wheezing or rales.  Abdominal:     General: Bowel sounds are normal. There is no distension.     Palpations: Abdomen is soft. There is no mass.     Tenderness: There is abdominal tenderness. There is guarding.     Comments: This patient has acute tenderness with guarding and  mild peritoneal signs in the upper abdomen, the lower abdomen is completely nontender very soft.  There is normal bowel sounds  Musculoskeletal:        General: No tenderness. Normal range of motion.     Cervical back: Normal range of motion and neck supple.     Right lower leg: No edema.     Left lower leg: No edema.  Lymphadenopathy:     Cervical: No cervical adenopathy.  Skin:    General: Skin is warm and dry.     Findings: No erythema or rash.  Neurological:     Mental Status: He is alert.     Coordination: Coordination normal.  Psychiatric:        Behavior: Behavior normal.     ED Results / Procedures / Treatments   Labs (all labs ordered are listed, but only abnormal results are displayed) Labs Reviewed  COMPREHENSIVE METABOLIC PANEL - Abnormal; Notable for the following components:      Result Value    Glucose, Bld 109 (*)    All other components within normal limits  URINALYSIS, ROUTINE W REFLEX MICROSCOPIC - Abnormal; Notable for the following components:   Color, Urine STRAW (*)    Specific Gravity, Urine 1.002 (*)    Hgb urine dipstick MODERATE (*)    All other components within normal limits  LIPASE, BLOOD  CBC    EKG None  Radiology CT ABDOMEN PELVIS W CONTRAST  Result Date: 05/17/2022 CLINICAL DATA:  Upper abdominal pain; history of stomach ulcers EXAM: CT ABDOMEN AND PELVIS WITH CONTRAST TECHNIQUE: Multidetector CT imaging of the abdomen and pelvis was performed using the standard protocol following bolus administration of intravenous contrast. RADIATION DOSE REDUCTION: This exam was performed according to the departmental dose-optimization program which includes automated exposure control, adjustment of the mA and/or kV according to patient size and/or use of iterative reconstruction technique. CONTRAST:  OMNIPAQUE IOHEXOL 300 MG/ML  SOLN COMPARISON:  CT 09/06/2019 FINDINGS: Lower chest: No acute abnormality. Hepatobiliary: Tiny hepatic cysts. No follow-up recommended. Cholecystectomy. No biliary dilation. Pancreas: No ductal dilation or adjacent inflammatory change. Spleen: Unremarkable. Adrenals/Urinary Tract: Normal adrenal glands. 4 mm stone in the mid left ureter with mild upstream hydroureteronephrosis. Mild urothelial thickening and enhancement about the proximal left ureter. Low-attenuation lesions in the kidneys are statistically likely to represent cysts. No follow-up is required. Unremarkable bladder. Stomach/Bowel: Stomach is within normal limits. Appendix appears normal. No evidence of bowel wall thickening, distention, or inflammatory changes. Vascular/Lymphatic: No significant vascular findings are present. No enlarged abdominal or pelvic lymph nodes. Reproductive: Unremarkable. Other: No free intraperitoneal fluid or air. Musculoskeletal: No acute osseous  abnormality. IMPRESSION: 4 mm stone in the mid left ureter with mild associated upstream hydroureteronephrosis. Electronically Signed   By: Minerva Fester M.D.   On: 05/17/2022 19:23    Procedures Procedures    Medications Ordered in ED Medications  0.9 %  sodium chloride infusion (0 mLs Intravenous Stopped 05/17/22 2051)  fentaNYL (SUBLIMAZE) injection 50 mcg (50 mcg Intravenous Not Given 05/17/22 1843)  ondansetron (ZOFRAN) injection 4 mg (4 mg Intravenous Given 05/17/22 1844)  iohexol (OMNIPAQUE) 300 MG/ML solution 100 mL (100 mLs Intravenous Contrast Given 05/17/22 1905)  ketorolac (TORADOL) 30 MG/ML injection 30 mg (30 mg Intravenous Given 05/17/22 1942)    ED Course/ Medical Decision Making/ A&P  Medical Decision Making Amount and/or Complexity of Data Reviewed Labs: ordered. Radiology: ordered.  Risk Prescription drug management.   This patient presents to the ED for concern of severe abdominal pain after eating, this involves an extensive number of treatment options, and is a complaint that carries with it a high risk of complications and morbidity.  The differential diagnosis includes pancreatitis, cholecystitis, peptic ulcer disease, perforation, the patient does not have a gallbladder anymore, this may be severe peptic ulcer disease though he went from no pain to fairly severe pain without any ramp up period.     Co morbidities that complicate the patient evaluation  Prior cholecystectomy   Additional history obtained:  Additional history obtained from the patient and his significant other External records from outside source obtained and reviewed including prior work-up  And testing, there is no EGD present in the work-up in the past   Lab Tests:  I Ordered, and personally interpreted labs.  The pertinent results include: Urinalysis which was normal, no signs of hematuria, no signs of white blood cells or bacteria.  Lipase is normal,  metabolic panel is normal, CBC is normal.   Imaging Studies ordered:  I ordered imaging studies including CT scan of the abdomen and pelvis I independently visualized and interpreted imaging which showed there does appear to be a 4 mm stone in the mid left ureter with some hydroureter ureteral nephrosis on the left I agree with the radiologist interpretation   Cardiac Monitoring: / EKG:  The patient was maintained on a cardiac monitor.  I personally viewed and interpreted the cardiac monitored which showed an underlying rhythm of: Normal sinus rhythm   Consultations Obtained:  None   Problem List / ED Course / Critical interventions / Medication management  The patient was informed of his diagnosis, he was given IV fluids and Samet occasions, he felt better, before I was able to get back into the room the patient told the nurse he was leaving and asked them to take the IV out.  He left without paperwork or prescription medications.  He was aware of the need for treatment and yet he still left without these medicines. I ordered medication including ketorolac and IV fluids for pain and dehydration Reevaluation of the patient after these medicines showed that the patient improved I have reviewed the patients home medicines and have made adjustments as needed   Social Determinants of Health:  Left without completing his paperwork or receiving his prescriptions   Test / Admission - Considered:  Considered admission but the patient has a rather benign diagnosis and though it is painful it is not life-threatening.  No signs of urinary infection         Final Clinical Impression(s) / ED Diagnoses Final diagnoses:  Epigastric pain  Kidney stone on left side    Rx / DC Orders ED Discharge Orders     None         Eber Hong, MD 05/17/22 2118

## 2022-05-17 NOTE — ED Triage Notes (Signed)
Patient c/o upper abd pain that started this morning after eating egg sandwich and [place] bacon. Per patient laid down and felt better after sleeping. Patient then ate tomato sandwich and pain started again and has now been constant. Per patient nausea and vomiting. Denies any diarrhea or fevers. Per patient last BM this morning- normal. Patient reports hx of alpha gal and stomach ulcers.

## 2022-05-17 NOTE — ED Notes (Signed)
EDP made aware of pt leaving

## 2022-05-17 NOTE — ED Notes (Signed)
ED Provider at bedside. 

## 2022-05-17 NOTE — ED Notes (Signed)
Patient transported to CT 

## 2022-05-30 ENCOUNTER — Ambulatory Visit (HOSPITAL_COMMUNITY)
Admission: RE | Admit: 2022-05-30 | Discharge: 2022-05-30 | Disposition: A | Discharge status: Home / Self Care | Payer: Medicaid Other | Source: Ambulatory Visit | Attending: "Endocrinology | Admitting: "Endocrinology

## 2022-05-30 DIAGNOSIS — E042 Nontoxic multinodular goiter: Secondary | ICD-10-CM | POA: Insufficient documentation

## 2022-06-03 ENCOUNTER — Other Ambulatory Visit: Payer: Self-pay

## 2022-06-03 DIAGNOSIS — E042 Nontoxic multinodular goiter: Secondary | ICD-10-CM

## 2022-06-04 ENCOUNTER — Ambulatory Visit: Payer: Medicaid Other | Admitting: "Endocrinology

## 2022-06-05 LAB — PTH, INTACT AND CALCIUM
Calcium: 9.8 mg/dL (ref 8.7–10.2)
PTH: 38 pg/mL (ref 15–65)

## 2022-06-05 LAB — THYROGLOBULIN ANTIBODY: Thyroglobulin Antibody: <1 IU/mL (ref 0.0–0.9)

## 2022-06-05 LAB — T3, FREE: T3, Free: 3.8 pg/mL (ref 2.0–4.4)

## 2022-06-05 LAB — T4, FREE: Free T4: 1.38 ng/dL (ref 0.82–1.77)

## 2022-06-05 LAB — THYROID PEROXIDASE ANTIBODY: Thyroperoxidase Ab SerPl-aCnc: 11 IU/mL (ref 0–34)

## 2022-06-05 LAB — TSH: TSH: 1.33 u[IU]/mL (ref 0.450–4.500)

## 2022-06-10 ENCOUNTER — Ambulatory Visit (INDEPENDENT_AMBULATORY_CARE_PROVIDER_SITE_OTHER): Payer: Self-pay | Admitting: "Endocrinology

## 2022-06-10 ENCOUNTER — Encounter: Payer: Self-pay | Admitting: "Endocrinology

## 2022-06-10 VITALS — BP 112/84 | HR 72 | Ht 77.0 in | Wt 210.4 lb

## 2022-06-10 DIAGNOSIS — E042 Nontoxic multinodular goiter: Secondary | ICD-10-CM
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
## 2023-06-01 ENCOUNTER — Other Ambulatory Visit: Payer: Self-pay | Admitting: *Deleted

## 2023-06-01 ENCOUNTER — Telehealth: Payer: Self-pay | Admitting: Nurse Practitioner

## 2023-06-01 DIAGNOSIS — E042 Nontoxic multinodular goiter: Secondary | ICD-10-CM

## 2023-06-01 NOTE — Telephone Encounter (Signed)
Labs have been updated . 

## 2023-06-01 NOTE — Telephone Encounter (Signed)
Please update labs

## 2023-06-11 ENCOUNTER — Ambulatory Visit: Payer: Medicaid Other | Admitting: "Endocrinology

## 2023-06-11 ENCOUNTER — Other Ambulatory Visit (HOSPITAL_COMMUNITY)
Admission: RE | Admit: 2023-06-11 | Discharge: 2023-06-11 | Disposition: A | Discharge status: Home / Self Care | Payer: Medicare HMO | Source: Ambulatory Visit | Attending: "Endocrinology | Admitting: "Endocrinology

## 2023-06-11 DIAGNOSIS — E785 Hyperlipidemia, unspecified: Secondary | ICD-10-CM | POA: Diagnosis not present

## 2023-06-11 DIAGNOSIS — E042 Nontoxic multinodular goiter: Secondary | ICD-10-CM | POA: Insufficient documentation

## 2023-06-11 LAB — LIPID PANEL
Cholesterol: 204 mg/dL — ABNORMAL HIGH (ref 0–200)
HDL: 36 mg/dL — ABNORMAL LOW (ref >40)
LDL Cholesterol: 151 mg/dL — ABNORMAL HIGH (ref 0–99)
Total CHOL/HDL Ratio: 5.7 {ratio}
Triglycerides: 86 mg/dL (ref <150)
VLDL: 17 mg/dL (ref 0–40)

## 2023-06-11 LAB — T4, FREE: Free T4: 0.94 ng/dL (ref 0.61–1.12)

## 2023-06-11 LAB — TSH: TSH: 1.777 u[IU]/mL (ref 0.350–4.500)

## 2023-06-12 LAB — PTH, INTACT AND CALCIUM
Calcium, Total (PTH): 9.5 mg/dL (ref 8.7–10.2)
PTH: 20 pg/mL (ref 15–65)

## 2023-06-12 IMAGING — US US FNA BIOPSY THYROID 1ST LESION
1 series · 13 of 18 positions shown · non-contrast
Comparison: US Thyroid 03/08/21

MEDICATIONS:
10 cc 1% lidocaine

COMPLICATIONS:
None immediate.

INDICATION: Right inferior thyroid nodule

1.4 cm
Pt with first degree relative with thyroid cancer per Dr Klever note
EXAM:
ULTRASOUND GUIDED FINE NEEDLE ASPIRATION OF INDETERMINATE THYROID
NODULE
TECHNIQUE: Informed written consent was obtained from the patient after a
discussion of the risks, benefits and alternatives to treatment.
Questions regarding the procedure were encouraged and answered. A
timeout was performed prior to the initiation of the procedure.

[Series 1: us fna bx thyroid 1st lesion afirma · 18 acquisitions, 13 frames shown]
[im 1/18]
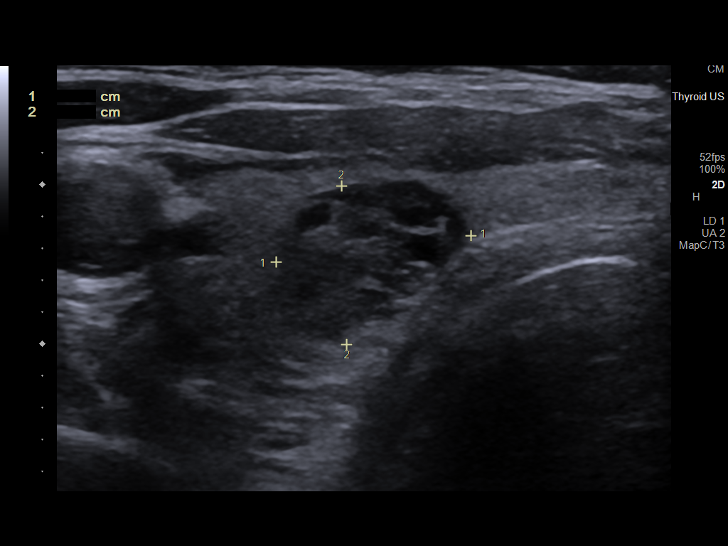
[im 3/18]
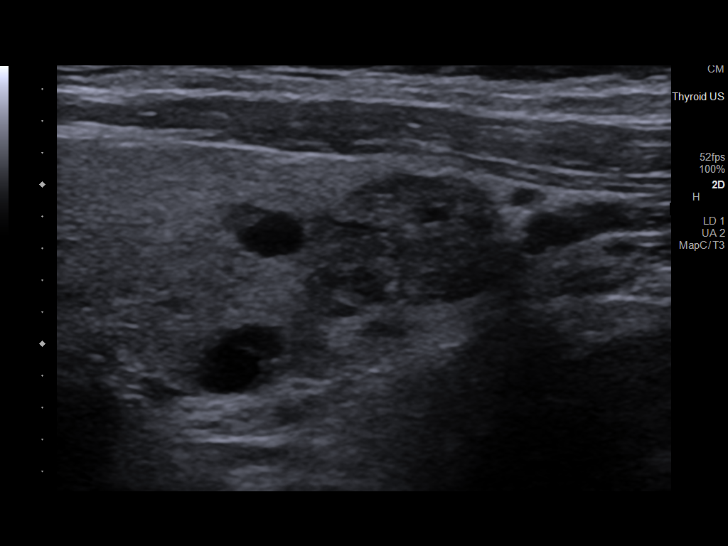
[im 4/18]
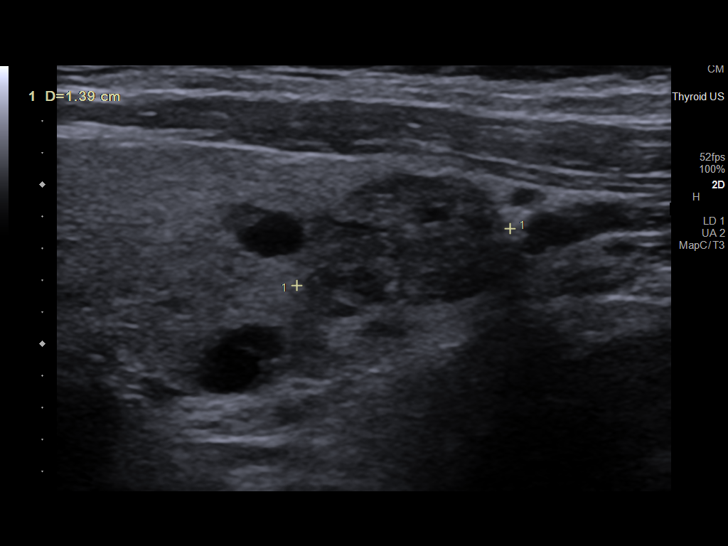
[im 5/18]
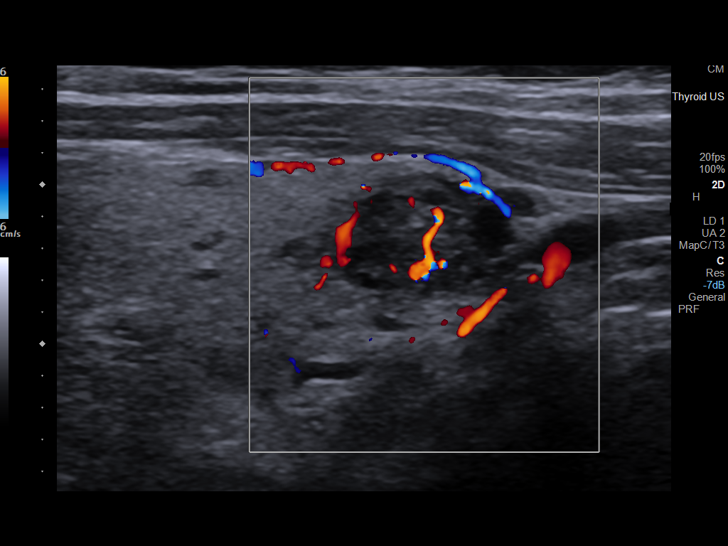
[im 7/18]
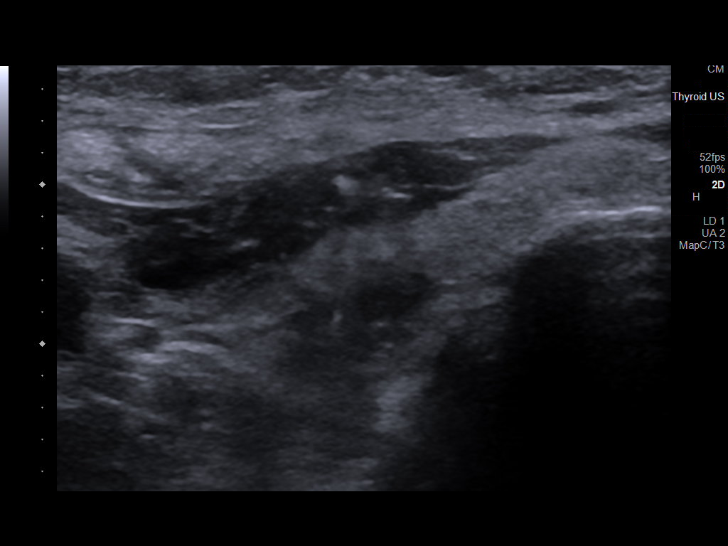
[im 8/18]
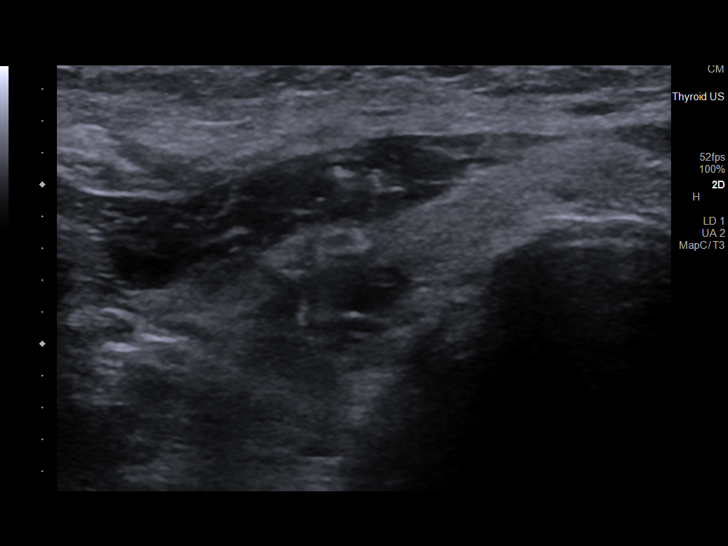
[im 10/18]
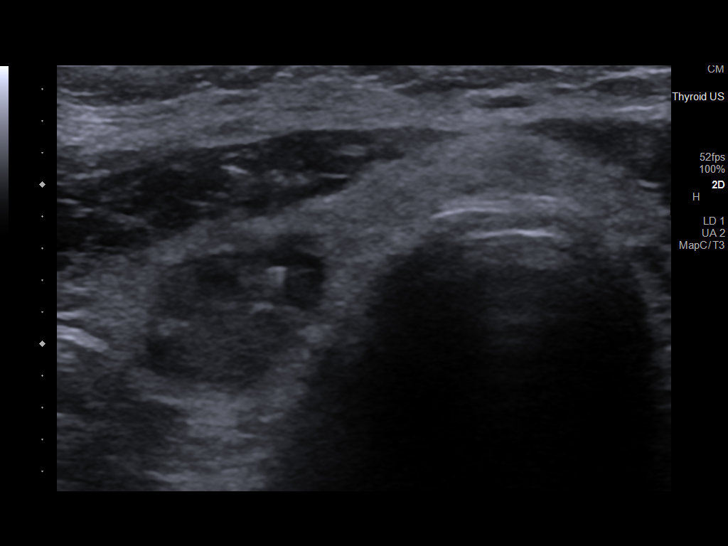
[im 11/18]
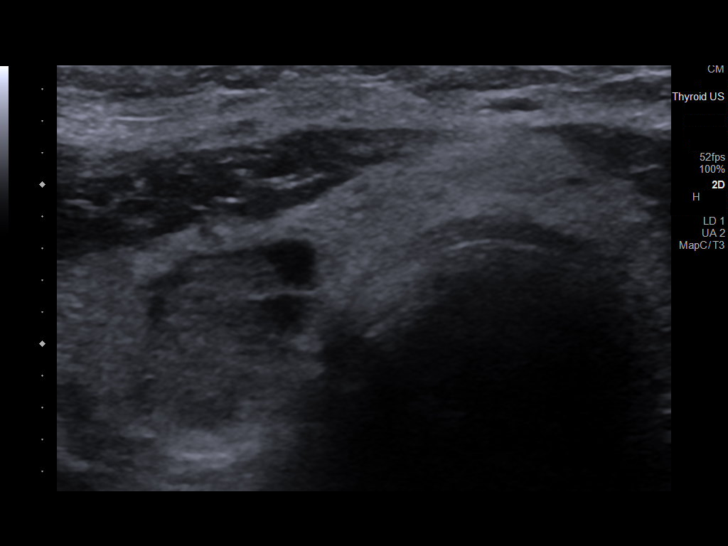
[im 12/18]
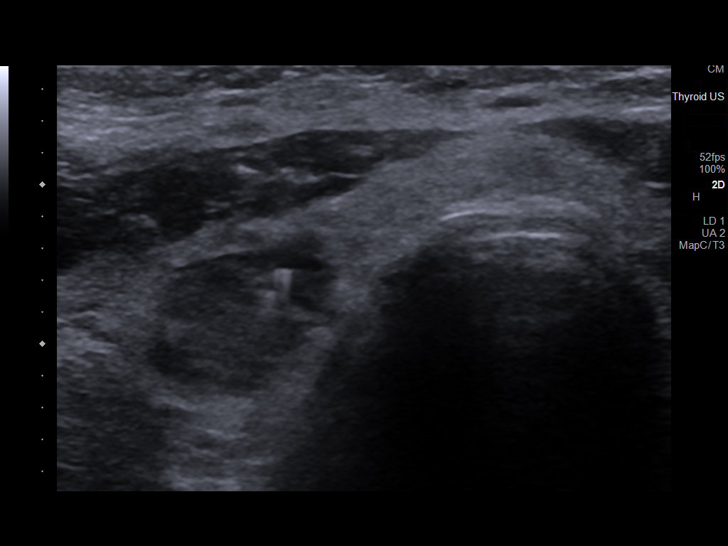
[im 14/18]
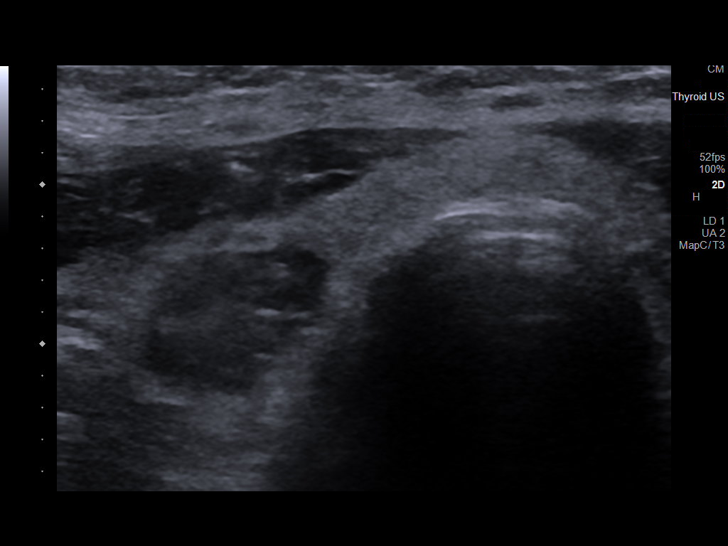
[im 15/18]
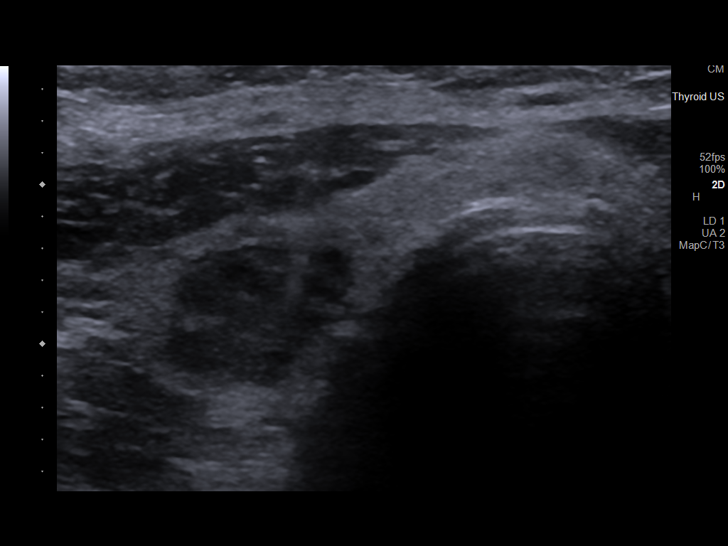
[im 16/18]
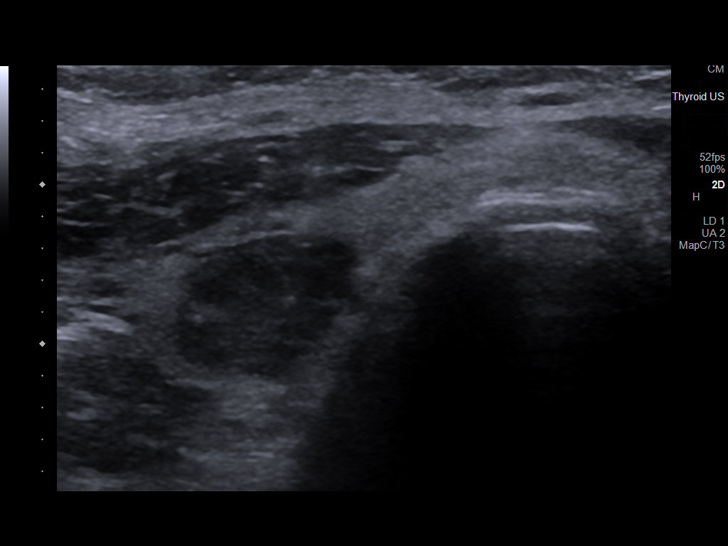
[im 18/18]
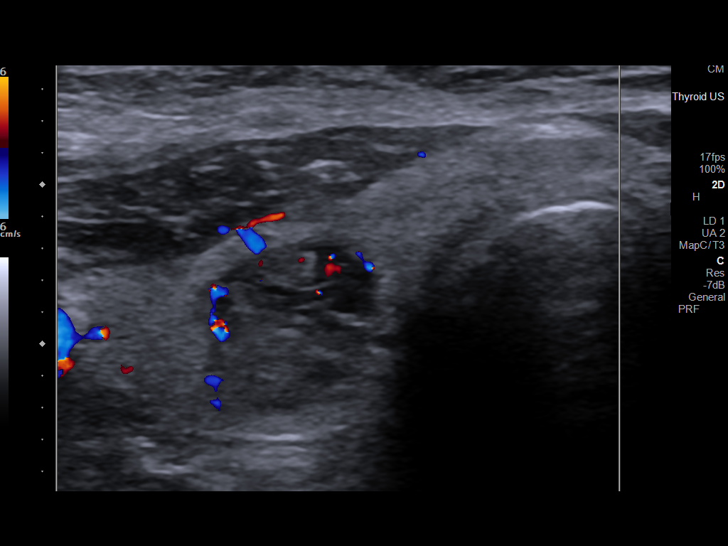

[13 of 18 positions shown; findings below may reference images not displayed]

Pre-procedural ultrasound scanning demonstrated unchanged size and
appearance of the indeterminate nodule within the right thyroid

The procedure was planned. The neck was prepped in the usual sterile
fashion, and a sterile drape was applied covering the operative
field. A timeout was performed prior to the initiation of the
procedure. Local anesthesia was provided with 1% lidocaine.

Under direct ultrasound guidance, 5 FNA biopsies were performed of
the right mid lobe thyroid nodule with a 25 gauge needle.

2 of these samples were obtained for AFIRMA

Multiple ultrasound images were saved for procedural documentation
purposes. The samples were prepared and submitted to pathology.

Limited post procedural scanning was negative for hematoma or
additional complication. Dressings were placed. The patient
tolerated the above procedures procedure well without immediate
postprocedural complication.
FINDINGS: Nodule reference number based on prior diagnostic ultrasound: 1

Maximum size: 1.4 cm

Location: Right; Mid

ACR TI-RADS risk category: TR4 (4-6 points)

Reason for biopsy: meets ACR TI-RADS criteria

Ultrasound imaging confirms appropriate placement of the needles
within the thyroid nodule.
IMPRESSION: Technically successful ultrasound guided fine needle aspiration of
right mid lobe thyroid nodule

Read by

Megantoro Sampune

## 2023-06-15 ENCOUNTER — Ambulatory Visit: Payer: Medicare HMO | Admitting: "Endocrinology

## 2023-06-15 ENCOUNTER — Encounter: Payer: Self-pay | Admitting: "Endocrinology

## 2023-06-15 VITALS — BP 120/74 | HR 68 | Ht 76.0 in | Wt 220.6 lb

## 2023-06-15 DIAGNOSIS — E042 Nontoxic multinodular goiter: Secondary | ICD-10-CM

## 2023-06-15 NOTE — Progress Notes (Signed)
06/15/2023, 6:54 PM  Endocrinology follow-up note   Subjective:    Patient ID: Jeff Schultz, male    DOB: Apr 26, 1966, PCP Schultz, Jeff Reil, FNP   Past Medical History:  Diagnosis Date   Allergy to alpha-gal    B12 deficiency    Hemopneumothorax    Hyperlipidemia    Jaw fracture (HCC)    Parathyroid abnormality (HCC)    Skull fracture (HCC)    Past Surgical History:  Procedure Laterality Date   BIOPSY THYROID     CHEST TUBE INSERTION     collapsed lung     Social History   Socioeconomic History   Marital status: Married    Spouse name: Not on file   Number of children: 1   Years of education: Not on file   Highest education level: Not on file  Occupational History   Occupation: truck driver  Tobacco Use   Smoking status: Never   Smokeless tobacco: Current    Types: Snuff  Vaping Use   Vaping status: Never Used  Substance and Sexual Activity   Alcohol use: Yes    Comment: occasional   Drug use: No   Sexual activity: Never  Other Topics Concern   Not on file  Social History Narrative   Not on file   Social Drivers of Health   Financial Resource Strain: Not on file  Food Insecurity: Not on file  Transportation Needs: Not on file  Physical Activity: Not on file  Stress: Not on file  Social Connections: Unknown (11/01/2021)   Received from [facility], [facility]   Social Network    Social Network: Not on file   Family History  Problem Relation Age of Onset   Cancer Other    Diabetes Other    Heart attack Other    Stroke Other    Outpatient Encounter Medications as of 06/15/2023  Medication Sig   [DISCONTINUED] gabapentin (NEURONTIN) 300 MG capsule Take 600 mg by mouth at bedtime.   No facility-administered encounter medications on file as of 06/15/2023.   ALLERGIES: Allergies  Allergen Reactions   Penicillins Anaphylaxis    Has patient had a PCN reaction causing immediate rash,  facial/tongue/throat swelling, SOB or lightheadedness with hypotension: Yes Has patient had a PCN reaction causing severe rash involving mucus membranes or skin necrosis: Yes Has patient had a PCN reaction that required hospitalization No Has patient had a PCN reaction occurring within the last 10 years: No If all of the above answers are "NO", then may proceed with Cephalosporin use.    Fentanyl Other (See Comments)    Blacked out    Hydrocodone Itching    VACCINATION STATUS:  There is no immunization history on file for this patient.  HPI Jeff Schultz is 57 y.o. male who presents today with a medical history as above. he is being seen in follow up after he was seen in consultation for multinodular goiter, hypercalcemia requested by Schultz, Jeff Reil, FNP.   See notes from his prior visit.  Review of ultrasound done at [facility] in September showed that he has subcentimeter scattered thyroid nodules in bilateral thyroid lobes, approximately 1.4 cm TI-RADS category 4 nodule in the  right inferior gland . He was sent for fine-needle aspiration biopsy of this nodule which returned as benign findings.   His pre-visit repeat u/s is unremarkable.  Patient denies any prior history of thyroid dysfunction.  However patient reports history of thyroid malignancy in his mother in her 28s. He does not know the details of the cancer diagnosis.  He reports, denies  difficulty swallowing.  He denies shortness of breath, nor voice change.    He has previous smoking history, currently tobacco user by chewing.  He does not report any recent major changes in his weight.  He was also found to have mild hypercalcemia on 2 occasions since August 2022.  Highest calcium level has been 10.6.  His repeat labs showed normal calcium and PTH as well as his pre-visit TFTs.  His most recent thyroid function test on February 09, 2023 remain consistent with euthyroid state.  He denies any exposure to neck  radiation.   Review of Systems  Constitutional: no recent weight gain/loss, + fatigue, no subjective hyperthermia, no subjective hypothermia   Objective:       06/15/2023    2:44 PM 06/10/2022   10:19 AM 05/17/2022    8:30 PM  Vitals with BMI  Height 6\' 4"  6\' 5"    Weight 220 lbs 10 oz 210 lbs 6 oz   BMI 26.86 24.94   Systolic 120 112 629  Diastolic 74 84 88  Pulse 68 72 63    BP 120/74   Pulse 68   Ht 6\' 4"  (1.93 m)   Wt 220 lb 9.6 oz (100.1 kg)   BMI 26.85 kg/m   Wt Readings from Last 3 Encounters:  06/15/23 220 lb 9.6 oz (100.1 kg)  06/10/22 210 lb 6.4 oz (95.4 kg)  05/17/22 204 lb (92.5 kg)    Physical Exam  Constitutional:  Body mass index is 26.85 kg/m.,  not in acute distress, normal state of mind   CMP ( most recent) CMP     Component Value Date/Time   NA 139 05/17/2022 1710   K 3.7 05/17/2022 1710   CL 101 05/17/2022 1710   CO2 29 05/17/2022 1710   GLUCOSE 109 (H) 05/17/2022 1710   BUN 14 05/17/2022 1710   CREATININE 0.94 05/17/2022 1710   CALCIUM 9.5 06/11/2023 0908   PROT 8.1 05/17/2022 1710   ALBUMIN 4.1 05/17/2022 1710   AST 18 05/17/2022 1710   ALT 23 05/17/2022 1710   ALKPHOS 110 05/17/2022 1710   BILITOT 0.5 05/17/2022 1710   GFRNONAA >60 05/17/2022 1710   GFRAA >60 01/18/2018 0833     Diabetic Labs (most recent): Lab Results  Component Value Date   HGBA1C 5.6 01/19/2018   HGBA1C 5.4 10/22/2016     Lipid Panel ( most recent) Lipid Panel     Component Value Date/Time   CHOL 204 (H) 06/11/2023 0907   TRIG 86 06/11/2023 0907   HDL 36 (L) 06/11/2023 0907   CHOLHDL 5.7 06/11/2023 0907   VLDL 17 06/11/2023 0907   LDLCALC 151 (H) 06/11/2023 0907      Lab Results  Component Value Date   TSH 1.777 06/11/2023   TSH 1.330 06/04/2022   TSH 0.992 05/09/2021   TSH 0.308 (L) 10/22/2016   FREET4 0.94 06/11/2023   FREET4 1.38 06/04/2022   FREET4 0.92 05/09/2021          Ultrasound of his thyroid from March 08, 2021 Right lobe: 6.2 x 1.5 x 2.3 cm ,Left lobe:  5.7 x 1.1 x 2.1 cm   1.4 cm TI-RADS category 4 nodule in the right inferior gland meets criteria to warrant imaging surveillance.    Fine-needle aspiration biopsy on May 30, 2021. Clinical History: 1.4 cm RLL  Specimen Submitted:  A. THYROID, RIGHT, FINE NEEDLE ASPIRATION:  FINAL MICROSCOPIC DIAGNOSIS:  - Consistent with benign follicular nodule (Bethesda category II)    Thyroid ultrasound 05/30/22  Nodule # 4: The previously biopsied nodule in the right inferior gland is unchanged at 1.3 x 1.2 x 1.0 cm compared to 1.4 x 1.2 x 1.0 cm previously.   Multiple scattered bilateral subcentimeter thyroid nodules noted throughout the gland. None meet criteria to warrant further evaluation.   IMPRESSION: 1. No significant interval change in the size or appearance of the previously biopsied nodule in the right inferior gland. Presuming a benign prior biopsy result, no imaging follow-up is recommended. 2. No new thyroid nodules or suspicious features that would warrant additional follow-up.  Assessment & Plan:   1. Multinodular goiter-benign cytology 2. Hypercalcemia-resolved  I discussed his new and existing labs, biopsy results, imaging studies.  He presents with euthyroid state. -He does not have malignancy.  His biopsy results are benign.  He will not need surgical intervention at this time.   He will not need any intervention with prescription.  He will return to clinic as needed.  He may need thyroid function test at least once a year.  - he is advised to maintain close follow up with Schultz, Jeff Reil, FNP for primary care needs.  I spent  22  minutes in the care of the patient today including review of labs from Thyroid Function, CMP, and other relevant labs ; imaging/biopsy records (current and previous including abstractions from other facilities); face-to-face time discussing  his lab results and symptoms, medications doses, his  options of short and long term treatment based on the latest standards of care / guidelines;   and documenting the encounter.  Jeff Schultz  participated in the discussions, expressed understanding, and voiced agreement with the above plans.  All questions were answered to his satisfaction. he is encouraged to contact clinic should he have any questions or concerns prior to his return visit.   Follow up plan: Return if symptoms worsen or fail to improve.   Marquis Lunch, MD [facility] [address] [place], [place] [address] Phone: [phone]  Fax: [phone]     06/15/2023, 6:54 PM  This note was partially dictated with voice recognition software. Similar sounding words can be transcribed inadequately or may not  be corrected upon review.

## 2023-07-19 ENCOUNTER — Emergency Department (HOSPITAL_COMMUNITY)
Admission: EM | Admit: 2023-07-19 | Discharge: 2023-07-19 | Disposition: A | Discharge status: Home / Self Care | Payer: Medicare HMO

## 2023-07-19 ENCOUNTER — Other Ambulatory Visit: Payer: Self-pay

## 2023-07-19 ENCOUNTER — Emergency Department (HOSPITAL_COMMUNITY): Payer: Medicare HMO

## 2023-07-19 DIAGNOSIS — N201 Calculus of ureter: Secondary | ICD-10-CM

## 2023-07-19 DIAGNOSIS — R109 Unspecified abdominal pain: Secondary | ICD-10-CM | POA: Diagnosis present

## 2023-07-19 DIAGNOSIS — N132 Hydronephrosis with renal and ureteral calculous obstruction: Secondary | ICD-10-CM | POA: Diagnosis not present

## 2023-07-19 LAB — COMPREHENSIVE METABOLIC PANEL
ALT: 27 U/L (ref 0–44)
AST: 24 U/L (ref 15–41)
Albumin: 4.6 g/dL (ref 3.5–5.0)
Alkaline Phosphatase: 120 U/L (ref 38–126)
Anion gap: 10 (ref 5–15)
BUN: 13 mg/dL (ref 6–20)
CO2: 29 mmol/L (ref 22–32)
Calcium: 10.1 mg/dL (ref 8.9–10.3)
Chloride: 99 mmol/L (ref 98–111)
Creatinine, Ser: 1.16 mg/dL (ref 0.61–1.24)
GFR, Estimated: >60 mL/min (ref >60)
Glucose, Bld: 93 mg/dL (ref 70–99)
Potassium: 4 mmol/L (ref 3.5–5.1)
Sodium: 138 mmol/L (ref 135–145)
Total Bilirubin: 0.9 mg/dL (ref 0.0–1.2)
Total Protein: 9.1 g/dL — ABNORMAL HIGH (ref 6.5–8.1)

## 2023-07-19 LAB — CBC WITH DIFFERENTIAL/PLATELET
Abs Immature Granulocytes: 0.03 10*3/uL (ref 0.00–0.07)
Basophils Absolute: 0.1 10*3/uL (ref 0.0–0.1)
Basophils Relative: 1 %
Eosinophils Absolute: 0.8 10*3/uL — ABNORMAL HIGH (ref 0.0–0.5)
Eosinophils Relative: 9 %
HCT: 48.3 % (ref 39.0–52.0)
Hemoglobin: 16.4 g/dL (ref 13.0–17.0)
Immature Granulocytes: 0 %
Lymphocytes Relative: 20 %
Lymphs Abs: 1.7 10*3/uL (ref 0.7–4.0)
MCH: 30.9 pg (ref 26.0–34.0)
MCHC: 34 g/dL (ref 30.0–36.0)
MCV: 91 fL (ref 80.0–100.0)
Monocytes Absolute: 0.5 10*3/uL (ref 0.1–1.0)
Monocytes Relative: 6 %
Neutro Abs: 5.4 10*3/uL (ref 1.7–7.7)
Neutrophils Relative %: 64 %
Platelets: 276 10*3/uL (ref 150–400)
RBC: 5.31 MIL/uL (ref 4.22–5.81)
RDW: 12.7 % (ref 11.5–15.5)
WBC: 8.4 10*3/uL (ref 4.0–10.5)
nRBC: 0 % (ref 0.0–0.2)

## 2023-07-19 LAB — URINALYSIS, ROUTINE W REFLEX MICROSCOPIC
Bacteria, UA: NONE SEEN
Bilirubin Urine: NEGATIVE
Glucose, UA: NEGATIVE mg/dL
Ketones, ur: NEGATIVE mg/dL
Leukocytes,Ua: NEGATIVE
Nitrite: NEGATIVE
Protein, ur: 100 mg/dL — AB
RBC / HPF: >50 RBC/hpf (ref 0–5)
Specific Gravity, Urine: 1.02 (ref 1.005–1.030)
pH: 7 (ref 5.0–8.0)

## 2023-07-19 LAB — LIPASE, BLOOD: Lipase: 23 U/L (ref 11–51)

## 2023-07-19 MED ORDER — ONDANSETRON HCL 4 MG/2ML IJ SOLN
4.0000 mg | Freq: Once | INTRAMUSCULAR | Status: AC
  Administered 2023-07-19: 4 mg via INTRAVENOUS
  Filled 2023-07-19: qty 2

## 2023-07-19 MED ORDER — MORPHINE SULFATE (PF) 4 MG/ML IV SOLN
4.0000 mg | Freq: Once | INTRAVENOUS | Status: AC
  Administered 2023-07-19: 4 mg via INTRAVENOUS
  Filled 2023-07-19: qty 1

## 2023-07-19 MED ORDER — TAMSULOSIN HCL 0.4 MG PO CAPS: 0.4000 mg | ORAL_CAPSULE | Freq: Every day | ORAL | 0 refills | Status: AC

## 2023-07-19 MED ORDER — ONDANSETRON 4 MG PO TBDP: 4.0000 mg | ORAL_TABLET | Freq: Four times a day (QID) | ORAL | 0 refills | Status: AC | PRN

## 2023-07-19 MED ORDER — ACETAMINOPHEN 500 MG PO TABS
1000.0000 mg | ORAL_TABLET | Freq: Once | ORAL | Status: AC
  Administered 2023-07-19: 1000 mg via ORAL
  Filled 2023-07-19: qty 2

## 2023-07-19 MED ORDER — KETOROLAC TROMETHAMINE 15 MG/ML IJ SOLN
15.0000 mg | Freq: Once | INTRAMUSCULAR | Status: AC
  Administered 2023-07-19: 15 mg via INTRAVENOUS
  Filled 2023-07-19: qty 1

## 2023-07-19 MED ORDER — NAPROXEN 500 MG PO TABS: 500.0000 mg | ORAL_TABLET | Freq: Two times a day (BID) | ORAL | 0 refills | Status: AC

## 2023-07-19 NOTE — ED Triage Notes (Signed)
Pt reports bilateral flank pain that started yesterday and has gotten worse. Pt states he passed a kidney stone yesterday.

## 2023-07-19 NOTE — ED Provider Notes (Signed)
Jeff Schultz AT Jeff Schultz Provider Note   CSN: [id number] Arrival date & time: 07/19/23  1130     History  Chief Complaint  Patient presents with   Flank Pain    DEMPSY Schultz is Schultz 58 y.o. male. He reports PMH of kidney stones presents to the ER complaining of bilateral flank pain that started yesterday suddenly.  States yesterday he passed Schultz kidney stone Schultz week but was persistent pain, he states "my kidneys are killing me".  States he has had this problem in the past.  Denies dysuria or hematuria.  No nausea or vomiting, no fever or chills but states he felt very hot and flushed on his way to the hospital today and had to turn his heat off no chest pain or shortness of breath   Flank Pain       Home Medications Prior to Admission medications   Medication Sig Start Date End Date Taking? Authorizing Provider  naproxen (NAPROSYN) 500 MG tablet Take 1 tablet (500 mg total) by mouth 2 (two) times daily. 07/19/23  Yes Jeff Milich A, [name]  ondansetron (ZOFRAN-ODT) 4 MG disintegrating tablet Take 1 tablet (4 mg total) by mouth every 6 (six) hours as needed for nausea or vomiting. 07/19/23  Yes Jeff Schultz, Jeff Baskin A, [name]  tamsulosin (FLOMAX) 0.4 MG CAPS capsule Take 1 capsule (0.4 mg total) by mouth daily after breakfast. 07/19/23  Yes Jeff Cosby A, [name]      Allergies    Penicillins, Fentanyl, and Hydrocodone    Review of Systems   Review of Systems  Genitourinary:  Positive for flank pain.    Physical Exam Updated Vital Signs BP 135/78   Pulse 68   Temp 98 F (36.7 C) (Oral)   Resp 18   Ht 6\' 4"  (1.93 m)   Wt 102.5 kg   SpO2 97%   BMI 27.51 kg/m  Physical Exam Vitals and nursing note reviewed.  Constitutional:      General: He is not in acute distress.    Appearance: He is well-developed.  HENT:     Head: Normocephalic and atraumatic.  Eyes:     Conjunctiva/sclera: Conjunctivae normal.  Cardiovascular:     Rate and  Rhythm: Normal rate and regular rhythm.     Heart sounds: No murmur heard. Pulmonary:     Effort: Pulmonary effort is normal. No respiratory distress.     Breath sounds: Normal breath sounds.  Abdominal:     General: There is no distension.     Palpations: Abdomen is soft.     Tenderness: There is abdominal tenderness in the left lower quadrant. There is right CVA tenderness and left CVA tenderness. There is no guarding or rebound.  Musculoskeletal:        General: No swelling.     Cervical back: Neck supple.  Skin:    General: Skin is warm and dry.     Capillary Refill: Capillary refill takes less than 2 seconds.  Neurological:     Mental Status: He is alert.  Psychiatric:        Mood and Affect: Mood normal.     ED Results / Procedures / Treatments   Labs (all labs ordered are listed, but only abnormal results are displayed) Labs Reviewed  CBC WITH DIFFERENTIAL/PLATELET - Abnormal; Notable for the following components:      Result Value   Eosinophils Absolute 0.8 (*)    All other components within normal limits  COMPREHENSIVE  METABOLIC PANEL - Abnormal; Notable for the following components:   Total Protein 9.1 (*)    All other components within normal limits  URINALYSIS, ROUTINE W REFLEX MICROSCOPIC - Abnormal; Notable for the following components:   APPearance CLOUDY (*)    Hgb urine dipstick MODERATE (*)    Protein, ur 100 (*)    All other components within normal limits  LIPASE, BLOOD    EKG None  Radiology CT Renal Stone Study Result Date: 07/19/2023 CLINICAL DATA:  Worsening left flank pain.  Nephrolithiasis. EXAM: CT ABDOMEN AND PELVIS WITHOUT CONTRAST TECHNIQUE: Multidetector CT imaging of the abdomen and pelvis was performed following the standard protocol without IV contrast. RADIATION DOSE REDUCTION: This exam was performed according to the departmental dose-optimization program which includes automated exposure control, adjustment of the Jeff and/or kV  according to patient size and/or use of iterative reconstruction technique. COMPARISON:  05/17/2022 FINDINGS: Lower chest: No acute findings. Hepatobiliary: No mass visualized on this unenhanced exam. Prior cholecystectomy. No evidence of biliary obstruction. Pancreas: No mass or inflammatory process visualized on this unenhanced exam. Spleen:  Within normal limits in size. Adrenals/Urinary tract: Mild left hydroureteronephrosis is seen due to Schultz 3 mm calculus in the mid left ureter. Stomach/Bowel: No evidence of obstruction, inflammatory process, or abnormal fluid collections. Normal appendix visualized. Vascular/Lymphatic: No pathologically enlarged lymph nodes identified. No evidence of abdominal aortic aneurysm. Reproductive:  No mass or other significant abnormality. Other:  None. Musculoskeletal:  No suspicious bone lesions identified. IMPRESSION: Mild left hydroureteronephrosis due to 3 mm mid left ureteral calculus. Electronically Signed   By: Jeff Orleans M.D.   On: 07/19/2023 13:20    Procedures Procedures    Medications Ordered in ED Medications  ketorolac (TORADOL) 15 MG/ML injection 15 mg (15 mg Intravenous Given 07/19/23 1237)  ondansetron (ZOFRAN) injection 4 mg (4 mg Intravenous Given 07/19/23 1238)  acetaminophen (TYLENOL) tablet 1,000 mg (1,000 mg Oral Given 07/19/23 1431)  morphine (PF) 4 MG/ML injection 4 mg (4 mg Intravenous Given 07/19/23 1513)    ED Course/ Medical Decision Making/ Schultz&P                                 Medical Decision Making This patient presents to the ED for concern of flank pain, this involves an extensive number of treatment options, and is Schultz complaint that carries with it Schultz high risk of complications and morbidity.  The differential diagnosis includes ureterolithiasis, muscle strain, diverticulitis, colitis, other   Co morbidities that complicate the patient evaluation  History of kidney stone   Additional history obtained:  Additional history  obtained from EMR External records from outside source obtained and reviewed including prior notes and labs   Lab Tests:  I Ordered, and personally interpreted labs.  The pertinent results include: Patient has hematuria and 16 white blood cells per high-power field on UA but no bacteria in his urine, no leukocytes or nitrite.  Lipase is normal, CBC shows no anemia or leukocytosis, CMP is normal.   Imaging Studies ordered:  I ordered imaging studies including ET abdomen pelvis I independently visualized and interpreted imaging which showed Schultz millimeter left mid ureteral stone with mild hydronephrosis I agree with the radiologist interpretation     Problem List / ED Course / Critical interventions / Medication management  Renal colic due to 3 mm left ureteral stone.  Patient has history of kidney stones, having bilateral flank  pain feels like prior kidney stones, reports he passed Schultz stone in his urine yesterday, he does have some hematuria today, labs otherwise normal, no UTI, CT shows 3 mm left ureteral stone, patient unable to take oxycodone or hydrocodone due to side effects he reports, states he cannot take fentanyl, given Toradol morphine here with good relief, given prescription for NSAIDs, Zofran and Flomax for home and advised on urology follow-up and strict return precautions.  I have reviewed the patients home medicines and have made adjustments as needed    Amount and/or Complexity of Data Reviewed Labs: ordered. Radiology: ordered.  Risk OTC drugs. Prescription drug management.           Final Clinical Impression(s) / ED Diagnoses Final diagnoses:  Ureterolithiasis    Rx / DC Orders ED Discharge Orders          Ordered    naproxen (NAPROSYN) 500 MG tablet  2 times daily        07/19/23 1531    tamsulosin (FLOMAX) 0.4 MG CAPS capsule  Daily after breakfast        07/19/23 1531    ondansetron (ZOFRAN-ODT) 4 MG disintegrating tablet  Every 6 hours PRN         07/19/23 1533              Jeff Rings, [name] 07/19/23 1748    Coral Spikes, DO 07/20/23 [phone]

## 2023-07-19 NOTE — Discharge Instructions (Addendum)
Was a pleasure taking care of you today.  You are seen in the ER with pain in your back, you have a kidney stone that is 3 mm on the left side.  You are given pain medicine and nausea medicine in the ER.  Prescribing pain medicine, nausea medicine and Flomax which is a medicine that may decrease the amount of time it takes you to pass the stone.  You need to follow-up with urology.  Come back to the ER if you have severe pain not controlled by medications at home, if you have vomiting not improved with medications, if you develop fever or any other worrisome changes.

## 2023-07-20 ENCOUNTER — Encounter (HOSPITAL_COMMUNITY): Payer: Self-pay

## 2023-07-20 ENCOUNTER — Other Ambulatory Visit: Payer: Self-pay

## 2023-07-20 ENCOUNTER — Emergency Department (HOSPITAL_COMMUNITY)
Admission: EM | Admit: 2023-07-20 | Discharge: 2023-07-20 | Discharge status: Against Medical Advice | Payer: Medicare HMO | Attending: Emergency Medicine | Admitting: Emergency Medicine

## 2023-07-20 DIAGNOSIS — Z5321 Procedure and treatment not carried out due to patient leaving prior to being seen by health care provider: Secondary | ICD-10-CM | POA: Insufficient documentation

## 2023-07-20 DIAGNOSIS — N200 Calculus of kidney: Secondary | ICD-10-CM | POA: Insufficient documentation

## 2023-07-20 DIAGNOSIS — R339 Retention of urine, unspecified: Secondary | ICD-10-CM | POA: Diagnosis present

## 2023-07-20 NOTE — ED Triage Notes (Signed)
Pt reports not being able to fully urinate in over 24 hours. Pt was here last night for multiple kidney stones that he has not been able to pass. States pain is worse and scared he has blockage.

## 2023-08-01 ENCOUNTER — Emergency Department (HOSPITAL_COMMUNITY): Payer: Medicare HMO

## 2023-08-01 ENCOUNTER — Emergency Department (HOSPITAL_COMMUNITY)
Admission: EM | Admit: 2023-08-01 | Discharge: 2023-08-01 | Disposition: A | Discharge status: Home / Self Care | Payer: Medicare HMO | Attending: Emergency Medicine | Admitting: Emergency Medicine

## 2023-08-01 DIAGNOSIS — W11XXXA Fall on and from ladder, initial encounter: Secondary | ICD-10-CM | POA: Insufficient documentation

## 2023-08-01 DIAGNOSIS — S59901A Unspecified injury of right elbow, initial encounter: Secondary | ICD-10-CM | POA: Diagnosis present

## 2023-08-01 NOTE — ED Triage Notes (Signed)
 Pt states he originally had a fall two days ago while standing on a ladder approximately 4' in the air landing on lumber, injuring his R elbow. Pt was seen at [facility] in [facility] and diagnosed with fx and splinted it. Pt followed up with orthopedist yesterday who reported that it was not fractured. Pt was getting out of bed today and used his R elbow to push himself up and heard and felt a loud popping sound approximately one hour ago. Pt took 600 mg Gabapentin PTA

## 2023-08-01 NOTE — Discharge Instructions (Signed)
 Your x-ray today did not show evidence of a fracture or dislocation.  Keep your arm splinted.  Take your pain medication as directed.  Keep your upcoming appointment with orthopedics for next week.

## 2023-08-03 NOTE — ED Provider Notes (Signed)
 [place] EMERGENCY DEPARTMENT AT [facility] Provider Note   CSN: [id number] Arrival date & time: 08/01/23  1430     History  Chief Complaint  Patient presents with   Elbow Injury    Jeff Schultz is a 58 y.o. male.  HPI     BECKER CHRISTOPHER is a 58 y.o. male who presents to the Emergency Department complaining of right elbow pain.  He states he fell off a ladder 2 days ago approximately 3 to 4 feet, landing on his right arm.  He was initially seen at an urgent care and diagnosed with a fracture, elbow and forearm were splinted.  He had follow-up appointment with orthopedics yesterday.  He states he was told the elbow was not fractured.  He was getting out of bed several hours ago when he pushed up on his right arm and felt a pop to his elbow.  He is here requesting x-ray for further evaluation.  No worsening pain or deformity.  He denies any numbness or tingling of his fingers or shoulder pain.  Home Medications Prior to Admission medications   Medication Sig Start Date End Date Taking? Authorizing Provider  naproxen  (NAPROSYN ) 500 MG tablet Take 1 tablet (500 mg total) by mouth 2 (two) times daily. 07/19/23   Suellen Cantor A, [name]  ondansetron  (ZOFRAN -ODT) 4 MG disintegrating tablet Take 1 tablet (4 mg total) by mouth every 6 (six) hours as needed for nausea or vomiting. 07/19/23   Suellen Cantor A, [name]  tamsulosin  (FLOMAX ) 0.4 MG CAPS capsule Take 1 capsule (0.4 mg total) by mouth daily after breakfast. 07/19/23   Suellen Cantor A, [name]      Allergies    Penicillins, Fentanyl , and Hydrocodone     Review of Systems   Review of Systems  Constitutional:  Negative for chills and fever.  Respiratory:  Negative for shortness of breath.   Cardiovascular:  Negative for chest pain.  Gastrointestinal:  Negative for nausea and vomiting.  Musculoskeletal:  Positive for arthralgias (Right elbow pain). Negative for back pain and neck pain.  Skin:  Negative for color  change and wound.  Neurological:  Negative for weakness and numbness.    Physical Exam Updated Vital Signs BP (!) 141/97 (BP Location: Right Arm)   Pulse 71   Temp 97.8 F (36.6 C) (Oral)   Resp 16   SpO2 98%  Physical Exam Vitals and nursing note reviewed.  Constitutional:      General: He is not in acute distress.    Appearance: Normal appearance. He is not ill-appearing or toxic-appearing.  Cardiovascular:     Rate and Rhythm: Normal rate and regular rhythm.     Pulses: Normal pulses.  Pulmonary:     Effort: Pulmonary effort is normal.  Musculoskeletal:        General: Tenderness and signs of injury present. No swelling.     Comments: Patient with long-arm splint applied to the right arm.  No erythema of the proximal arm.  No edema.  Has full range of motion of the fingers of the right hand.  No edema or discoloration noted to the fingers.  Skin:    General: Skin is warm.     Capillary Refill: Capillary refill takes less than 2 seconds.  Neurological:     General: No focal deficit present.     Mental Status: He is alert.     ED Results / Procedures / Treatments   Labs (all labs ordered are listed,  but only abnormal results are displayed) Labs Reviewed - No data to display  EKG None  Radiology DG Elbow Complete Right Result Date: 08/01/2023 CLINICAL DATA:  Right elbow pain.  Clemens off a ladder 2 days ago. EXAM: RIGHT ELBOW - COMPLETE 3+ VIEW COMPARISON:  None Available. FINDINGS: There is no evidence of fracture, dislocation, or joint effusion. There is no evidence of arthropathy or other focal bone abnormality. Prominent olecranon enthesophyte. Soft tissues are unremarkable. IMPRESSION: 1. No acute osseous abnormality. Electronically Signed   By: Elsie ONEIDA Shoulder M.D.   On: 08/01/2023 15:42     Procedures Procedures    Medications Ordered in ED Medications - No data to display  ED Course/ Medical Decision Making/ A&P                                 Medical  Decision Making   Patient here with previous injury to right elbow.  Was seen at an urgent care at time of injury.  Had follow-up appointment yesterday with orthopedics.  No fracture was found.  Elbow has been splinted prior to his arrival today.  Got out of bed this morning and felt a pop to his elbow.  Here for recheck.  Denies any new or worsening symptoms of his arm or elbow.  Amount and/or Complexity of Data Reviewed Radiology: ordered.    Details: X-ray of the right elbow without acute osseous abnormality Discussion of management or test interpretation with external provider(s):   Patient advised of x-ray findings.  Reassured.  He has orthopedic follow-up appointment for next week.  Will continue his current medications.  Appears appropriate for discharge home, all questions were answered           Final Clinical Impression(s) / ED Diagnoses Final diagnoses:  Injury of right elbow, initial encounter    Rx / DC Orders ED Discharge Orders     None         Herlinda Milling, [name] 08/03/23 2022    Franklyn Sid SAILOR, MD 08/15/23 1136

## 2023-08-04 ENCOUNTER — Ambulatory Visit: Payer: Medicare HMO | Admitting: Orthopedic Surgery
